# Patient Record
Sex: Female | Born: 1965 | Race: White | Hispanic: No | Smoking: Current every day smoker
Health system: Southern US, Community
[De-identification: ages and names within clinical notes are randomized; demographics above are authoritative.]

## PROBLEM LIST (undated history)

## (undated) DIAGNOSIS — F32A Depression, unspecified: Secondary | ICD-10-CM

## (undated) DIAGNOSIS — F329 Major depressive disorder, single episode, unspecified: Secondary | ICD-10-CM

## (undated) DIAGNOSIS — N939 Abnormal uterine and vaginal bleeding, unspecified: Secondary | ICD-10-CM

## (undated) DIAGNOSIS — K219 Gastro-esophageal reflux disease without esophagitis: Secondary | ICD-10-CM

## (undated) DIAGNOSIS — Z973 Presence of spectacles and contact lenses: Secondary | ICD-10-CM

## (undated) DIAGNOSIS — T4145XA Adverse effect of unspecified anesthetic, initial encounter: Secondary | ICD-10-CM

## (undated) DIAGNOSIS — F419 Anxiety disorder, unspecified: Secondary | ICD-10-CM

## (undated) DIAGNOSIS — I10 Essential (primary) hypertension: Secondary | ICD-10-CM

## (undated) DIAGNOSIS — T8859XA Other complications of anesthesia, initial encounter: Secondary | ICD-10-CM

## (undated) DIAGNOSIS — D649 Anemia, unspecified: Secondary | ICD-10-CM

## (undated) DIAGNOSIS — D259 Leiomyoma of uterus, unspecified: Secondary | ICD-10-CM

## (undated) HISTORY — DX: Major depressive disorder, single episode, unspecified: F32.9

## (undated) HISTORY — DX: Anxiety disorder, unspecified: F41.9

## (undated) HISTORY — DX: Essential (primary) hypertension: I10

## (undated) HISTORY — DX: Depression, unspecified: F32.A

## (undated) HISTORY — DX: Anemia, unspecified: D64.9

---
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
## 1983-04-25 HISTORY — PX: WISDOM TOOTH EXTRACTION: SHX21

## 1998-03-15 ENCOUNTER — Other Ambulatory Visit: Admission: RE | Admit: 1998-03-15 | Discharge: 1998-03-15 | Payer: Self-pay | Admitting: Obstetrics and Gynecology

## 1999-05-10 ENCOUNTER — Other Ambulatory Visit: Admission: RE | Admit: 1999-05-10 | Discharge: 1999-05-10 | Payer: Self-pay | Admitting: Obstetrics and Gynecology

## 2000-05-21 ENCOUNTER — Other Ambulatory Visit: Admission: RE | Admit: 2000-05-21 | Discharge: 2000-05-21 | Payer: Self-pay | Admitting: Obstetrics and Gynecology

## 2001-05-28 ENCOUNTER — Other Ambulatory Visit: Admission: RE | Admit: 2001-05-28 | Discharge: 2001-05-28 | Payer: Self-pay | Admitting: Obstetrics and Gynecology

## 2002-05-28 ENCOUNTER — Other Ambulatory Visit: Admission: RE | Admit: 2002-05-28 | Discharge: 2002-05-28 | Payer: Self-pay | Admitting: Obstetrics and Gynecology

## 2003-06-09 ENCOUNTER — Other Ambulatory Visit: Admission: RE | Admit: 2003-06-09 | Discharge: 2003-06-09 | Payer: Self-pay | Admitting: Obstetrics and Gynecology

## 2004-06-20 ENCOUNTER — Other Ambulatory Visit: Admission: RE | Admit: 2004-06-20 | Discharge: 2004-06-20 | Payer: Self-pay | Admitting: Obstetrics and Gynecology

## 2004-12-12 ENCOUNTER — Other Ambulatory Visit: Admission: RE | Admit: 2004-12-12 | Discharge: 2004-12-12 | Payer: Self-pay | Admitting: Obstetrics and Gynecology

## 2004-12-15 ENCOUNTER — Encounter: Admission: RE | Admit: 2004-12-15 | Discharge: 2004-12-15 | Payer: Self-pay | Admitting: Obstetrics and Gynecology

## 2006-08-01 ENCOUNTER — Ambulatory Visit: Payer: Self-pay | Admitting: Internal Medicine

## 2007-08-28 ENCOUNTER — Encounter: Payer: Self-pay | Admitting: Orthopedic Surgery

## 2008-07-30 ENCOUNTER — Other Ambulatory Visit (HOSPITAL_COMMUNITY): Admission: RE | Admit: 2008-07-30 | Discharge: 2008-08-07 | Payer: Self-pay | Admitting: Psychiatry

## 2008-08-03 ENCOUNTER — Ambulatory Visit: Payer: Self-pay | Admitting: Psychiatry

## 2010-09-09 NOTE — Assessment & Plan Note (Signed)
[name]                         GASTROENTEROLOGY OFFICE NOTE   NAME:VERNONSigourney, Kim                        MRN:          [id number]  DATE:08/01/2006                            DOB:          1965-12-01    [name] is a very nice 45 year old white female referred by Dr. Timothy Lasso  for discussion of colorectal screening.  We saw her about 20 years ago  in association with her father, who had colon cancer in his early 42s,  and died shortly after the diagnosis was made.  At that time, I told  patient she ought to have a colonoscopy around the age of 32.  She is  essentially asymptomatic.  Dr. Timothy Lasso has done stool Hemoccult's.  She  has good eating habits.  She denies any rectal bleeding.   MEDICATIONS:  1. Lamictal 100 mg 2 p.o. nightly.  2. Prozac 20 mg 2 nightly.  3. Clonazepam 0.25 mg b.i.d.   PAST MEDICAL HISTORY:  Significant for hypoglycemia.  She had a  dysplastic mole taken off her skin.  She has history of headaches.  History of tonsillectomy and wisdom teeth extraction.   FAMILY HISTORY:  Positive for colon cancer.   SOCIAL HISTORY:  Married.  No children.  She works as a  Energy manager.  Patient does not smoke.  Drinks alcohol only  socially.   REVIEW OF SYSTEMS:  Positive for contact lenses.  Slight anemia.  Hemoglobin 11.9.  Hematocrit 30.7 with MCV of 97.   PHYSICAL EXAMINATION:  Blood pressure 136/88.  Pulse 68.  Weight 109  Kim.  She was alert, oriented, and in no distress.  Sclerae were anicteric.  Oral cavity was normal.  NECK:  Supple.  No adenopathy.  LUNGS:  Clear to auscultation.  COR:  With normal S1 and normal S2.  ABDOMEN:  Soft.  She had a full bladder.  Somewhat tympanically  distended abdomen, but non-tender except for the area over the bladder.  No CVA tenderness.  RECTAL:  Exam not done.  EXTREMITIES:  No edema.   IMPRESSION:  A 45 year old white female with known family history of  colon cancer in her  father, who died in early 29s shortly after the  diagnosis was made.  Her sister already had 2 colonoscopies, and  has  had polyps, but we do not have that  documantation.  Patient, herself,  is interested in having colorectal screening. She remains asymptomatic.  She has a slight normocytic anemia.   PLAN:  Colonoscopy discussed with the patient.  She wants to go ahead  with the exam using standard  prep.  She is not on any medications that  would compromise the exam.  We discussed conscious sedation, prep, and  the procedure itself.     Hedwig Morton. Juanda Chance, MD  Electronically Signed    DMB/MedQ  DD: 08/01/2006  DT: 08/01/2006  Job #: [id number]   cc:   Katie Pounds, MD

## 2014-01-29 ENCOUNTER — Encounter: Payer: Self-pay | Admitting: Internal Medicine

## 2014-04-03 ENCOUNTER — Encounter: Payer: Self-pay | Admitting: Internal Medicine

## 2014-08-03 ENCOUNTER — Encounter: Payer: Self-pay | Admitting: Internal Medicine

## 2014-09-18 ENCOUNTER — Ambulatory Visit (AMBULATORY_SURGERY_CENTER): Payer: Self-pay | Admitting: *Deleted

## 2014-09-18 VITALS — Ht 59.5 in | Wt 129.5 lb

## 2014-09-18 DIAGNOSIS — Z1211 Encounter for screening for malignant neoplasm of colon: Secondary | ICD-10-CM

## 2014-09-18 NOTE — Progress Notes (Signed)
No allergies to eggs or soy. No problems with anesthesia.  Pt given Emmi instructions for colonoscopy  No oxygen use  No diet drug use  

## 2014-09-23 HISTORY — PX: COLONOSCOPY: SHX174

## 2014-10-02 ENCOUNTER — Ambulatory Visit (AMBULATORY_SURGERY_CENTER): Payer: PRIVATE HEALTH INSURANCE | Admitting: Internal Medicine

## 2014-10-02 ENCOUNTER — Encounter: Payer: Self-pay | Admitting: Internal Medicine

## 2014-10-02 VITALS — BP 122/78 | HR 66 | Temp 97.9°F | Resp 21 | Ht 59.0 in | Wt 129.0 lb

## 2014-10-02 DIAGNOSIS — Z800 Family history of malignant neoplasm of digestive organs: Secondary | ICD-10-CM | POA: Diagnosis not present

## 2014-10-02 DIAGNOSIS — Z1211 Encounter for screening for malignant neoplasm of colon: Secondary | ICD-10-CM

## 2014-10-02 DIAGNOSIS — K635 Polyp of colon: Secondary | ICD-10-CM

## 2014-10-02 DIAGNOSIS — D125 Benign neoplasm of sigmoid colon: Secondary | ICD-10-CM

## 2014-10-02 MED ORDER — SODIUM CHLORIDE 0.9 % IV SOLN: 500.0000 mL | INTRAVENOUS | Status: DC

## 2014-10-02 NOTE — Progress Notes (Signed)
Report to PACU, RN, vss, BBS= Clear.  

## 2014-10-02 NOTE — Patient Instructions (Signed)
YOU HAD AN ENDOSCOPIC PROCEDURE TODAY AT THE [facility]:   Refer to the procedure report that was given to you for any specific questions about what was found during the examination.  If the procedure report does not answer your questions, please call your gastroenterologist to clarify.  If you requested that your care partner not be given the details of your procedure findings, then the procedure report has been included in a sealed envelope for you to review at your convenience later.  YOU SHOULD EXPECT: Some feelings of bloating in the abdomen. Passage of more gas than usual.  Walking can help get rid of the air that was put into your GI tract during the procedure and reduce the bloating. If you had a lower endoscopy (such as a colonoscopy or flexible sigmoidoscopy) you may notice spotting of blood in your stool or on the toilet paper. If you underwent a bowel prep for your procedure, you may not have a normal bowel movement for a few days.  Please Note:  You might notice some irritation and congestion in your nose or some drainage.  This is from the oxygen used during your procedure.  There is no need for concern and it should clear up in a day or so.  SYMPTOMS TO REPORT IMMEDIATELY:   Following lower endoscopy (colonoscopy or flexible sigmoidoscopy):  Excessive amounts of blood in the stool  Significant tenderness or worsening of abdominal pains  Swelling of the abdomen that is new, acute  Fever of 100F or higher    For urgent or emergent issues, a gastroenterologist can be reached at any hour by calling [phone].   DIET: Your first meal following the procedure should be a small meal and then it is ok to progress to your normal diet. Heavy or fried foods are harder to digest and may make you feel nauseous or bloated.  Likewise, meals heavy in dairy and vegetables can increase bloating.  Drink plenty of fluids but you should avoid alcoholic beverages for 24  hours.  ACTIVITY:  You should plan to take it easy for the rest of today and you should NOT DRIVE or use heavy machinery until tomorrow (because of the sedation medicines used during the test).    FOLLOW UP: Our staff will call the number listed on your records the next business day following your procedure to check on you and address any questions or concerns that you may have regarding the information given to you following your procedure. If we do not reach you, we will leave a message.  However, if you are feeling well and you are not experiencing any problems, there is no need to return our call.  We will assume that you have returned to your regular daily activities without incident.  If any biopsies were taken you will be contacted by phone or by letter within the next 1-3 weeks.  Please call us at [phone] if you have not heard about the biopsies in 3 weeks.    SIGNATURES/CONFIDENTIALITY: You and/or your care partner have signed paperwork which will be entered into your electronic medical record.  These signatures attest to the fact that that the information above on your After Visit Summary has been reviewed and is understood.  Full responsibility of the confidentiality of this discharge information lies with you and/or your care-partner.   Information on polyps given to you today 

## 2014-10-02 NOTE — Progress Notes (Signed)
Called to room to assist during endoscopic procedure.  Patient ID and intended procedure confirmed with present staff. Received instructions for my participation in the procedure from the performing physician.  

## 2014-10-02 NOTE — Op Note (Addendum)
[facility] [place], [address]   COLONOSCOPY PROCEDURE REPORT  PATIENT: Katie, Kim  MR#: [id number] BIRTHDATE: May 28, 1965 , 48  yrs. old GENDER: female ENDOSCOPIST: Lafayette Dragon, MD REFERRED FK:CLEX Virgina Jock, M.D. PROCEDURE DATE:  10/02/2014 PROCEDURE:   Colonoscopy, screening First Screening Colonoscopy - high  risk and is 50 yrs.  old or older Yes.  Prior Negative Screening - Now for repeat screening. N/A  History of Adenoma - Now for follow-up colonoscopy & has been > or = to 3 yrs.  N/A  Polyps removed today? Yes ASA CLASS:   Class I INDICATIONS:Screening for colonic neoplasia and Colorectal Neoplasm Risk Assessment for this procedure is above average risk. ,Father with colon cancer at age 44 MEDICATIONS: Monitored anesthesia care and Propofol 300 mg IV  DESCRIPTION OF PROCEDURE:   After the risks benefits and alternatives of the procedure were thoroughly explained, informed consent was obtained.  The digital rectal exam revealed no abnormalities of the rectum.   The LB [id number]  endoscope was introduced through the anus and advanced to the cecum, which was identified by both the appendix and ileocecal valve. No adverse events experienced.   The quality of the prep was fair.  The instrument was then slowly withdrawn as the colon was fully examined. Estimated blood loss is zero unless otherwise noted in this procedure report.      COLON FINDINGS: A firm sessile polyp measuring 3 mm in size was found in the sigmoid colon.  A polypectomy was performed with cold forceps.  The resection was complete, the polyp tissue was completely retrieved and sent to histology.  Retroflexed views revealed no abnormalities. The time to cecum = 11.37 Withdrawal time = 9.31   The scope was withdrawn and the procedure completed. COMPLICATIONS: There were no immediate complications.  ENDOSCOPIC IMPRESSION: Sessile polyp was found in the sigmoid  colon; polypectomy was performed with cold for   RECOMMENDATIONS: Await pathology results High fiber diet recall colonoscopy in 5 years Consider 2 day prep next time  eSigned:  Lafayette Dragon, MD 10/02/2014 8:16 AM Revised: 10/02/2014 8:16 AM  cc:   PATIENT NAME:  Katie, Kim MR#: [id number]

## 2014-10-05 ENCOUNTER — Telehealth: Payer: Self-pay

## 2014-10-05 NOTE — Telephone Encounter (Signed)
  Follow up Call-  Call back number 10/02/2014  Post procedure Call Back phone  # [phone]  Permission to leave phone message Yes     Patient questions:  Do you have a fever, pain , or abdominal swelling? No. Pain Score  0 *  Have you tolerated food without any problems? Yes.    Have you been able to return to your normal activities? Yes.    Do you have any questions about your discharge instructions: Diet   No. Medications  No. Follow up visit  No.  Do you have questions or concerns about your Care? No.  Actions: * If pain score is 4 or above: No action needed, pain <4.

## 2014-10-07 ENCOUNTER — Encounter: Payer: Self-pay | Admitting: Internal Medicine

## 2014-10-23 ENCOUNTER — Telehealth: Payer: Self-pay | Admitting: Internal Medicine

## 2014-10-23 NOTE — Telephone Encounter (Signed)
Patient notified of results.

## 2014-10-29 NOTE — H&P (Signed)
[name]  DICTATION # [id number] CSN# [id number]   Margarette Asal, MD 10/29/2014 10:05 AM

## 2014-10-30 NOTE — H&P (Signed)
[name], [name]               ACCOUNT NO.:  [id number]  MEDICAL RECORD NO.:  [id number]  LOCATION:                                FACILITY:  [facility]  PHYSICIAN:  Ralene Bathe. Matthew Saras, M.D.DATE OF BIRTH:  [birth year]  DATE OF ADMISSION:  11/12/2014 DATE OF DISCHARGE:                             HISTORY & PHYSICAL   CHIEF COMPLAINT:  Perimenopausal bleeding, endometrial polyp.  The patient has been complaining of menorrhagia recently.  FSH was 3.7.  She had a saline ultrasound that showed a well-defined polyp.  Presents at this time for D and C, hysteroscopy, with MyoSure resection.  This procedure including specific risks related to bleeding, infection, and other complications such as perforation, may require additional surgery discussed with her which she understands and accepts.  PAST MEDICAL HISTORY:  Allergies:  None.  CURRENT MEDICATIONS:  Lisinopril, Flexeril, Inderal, Abilify, clonazepam, Deplin, iron supplements.  PAST SURGICAL HISTORY:  Prior tonsillectomy.  REVIEW OF SYSTEMS:  Significant for anemia, osteopenia, hypertension.  FAMILY HISTORY:  Significant for anemia, stroke, osteoporosis, arthritis, skin disease, hypertension.  Father with colon cancer.  SOCIAL HISTORY:  Denies drug use.  She does smoke 1/2 PPD.  Social alcohol drinker.  She is divorced.  Dr. Shon Baton is her medical doctor.  PHYSICAL EXAMINATION:  VITAL SIGNS:  Temp 98.2, blood pressure 132/90. HEENT:  Unremarkable. NECK:  Supple without masses. LUNGS:  Clear.  CARDIOVASCULAR:  Regular rate and rhythm without murmurs, rubs, or gallops noted. BREASTS:  Without masses. ABDOMEN:  Soft, flat, nontender.  GU:  Vulva, vagina, cervix normal. Uterus mid position, normal size.  Adnexa negative.  IMPRESSION:  Menorrhagia with endometrial polyp.  PLAN:  Recent ultrasound showed possible adenomyosis with 3.0 x 2.9 cm intramural fibroid with well-defined polyp noted on saline infusion.     Unnamed Hino M.  Matthew Saras, M.D.     [facility]/MEDQ  D:  10/29/2014  T:  10/30/2014  Job:  [id number]

## 2014-11-11 ENCOUNTER — Other Ambulatory Visit: Payer: Self-pay

## 2014-11-11 ENCOUNTER — Encounter (HOSPITAL_COMMUNITY): Payer: Self-pay

## 2014-11-11 ENCOUNTER — Encounter (HOSPITAL_COMMUNITY)
Admission: RE | Admit: 2014-11-11 | Discharge: 2014-11-11 | Disposition: A | Discharge status: Home / Self Care | Payer: PRIVATE HEALTH INSURANCE | Source: Ambulatory Visit | Attending: Obstetrics and Gynecology | Admitting: Obstetrics and Gynecology

## 2014-11-11 DIAGNOSIS — M8580 Other specified disorders of bone density and structure, unspecified site: Secondary | ICD-10-CM | POA: Diagnosis not present

## 2014-11-11 DIAGNOSIS — N920 Excessive and frequent menstruation with regular cycle: Secondary | ICD-10-CM | POA: Diagnosis present

## 2014-11-11 DIAGNOSIS — F17210 Nicotine dependence, cigarettes, uncomplicated: Secondary | ICD-10-CM | POA: Diagnosis not present

## 2014-11-11 DIAGNOSIS — N840 Polyp of corpus uteri: Secondary | ICD-10-CM | POA: Diagnosis not present

## 2014-11-11 DIAGNOSIS — I10 Essential (primary) hypertension: Secondary | ICD-10-CM | POA: Diagnosis not present

## 2014-11-11 DIAGNOSIS — D649 Anemia, unspecified: Secondary | ICD-10-CM | POA: Diagnosis not present

## 2014-11-11 DIAGNOSIS — Z79899 Other long term (current) drug therapy: Secondary | ICD-10-CM | POA: Diagnosis not present

## 2014-11-11 HISTORY — DX: Gastro-esophageal reflux disease without esophagitis: K21.9

## 2014-11-11 LAB — BASIC METABOLIC PANEL
ANION GAP: 3 — AB (ref 5–15)
BUN: 11 mg/dL (ref 6–20)
CHLORIDE: 102 mmol/L (ref 101–111)
CO2: 28 mmol/L (ref 22–32)
CREATININE: 0.7 mg/dL (ref 0.44–1.00)
Calcium: 8.5 mg/dL — ABNORMAL LOW (ref 8.9–10.3)
Glucose, Bld: 86 mg/dL (ref 65–99)
POTASSIUM: 4 mmol/L (ref 3.5–5.1)
Sodium: 133 mmol/L — ABNORMAL LOW (ref 135–145)

## 2014-11-11 LAB — CBC
HCT: 32.9 % — ABNORMAL LOW (ref 36.0–46.0)
Hemoglobin: 11.1 g/dL — ABNORMAL LOW (ref 12.0–15.0)
MCH: 33.3 pg (ref 26.0–34.0)
MCHC: 33.7 g/dL (ref 30.0–36.0)
MCV: 98.8 fL (ref 78.0–100.0)
PLATELETS: 281 10*3/uL (ref 150–400)
RBC: 3.33 MIL/uL — AB (ref 3.87–5.11)
RDW: 13.4 % (ref 11.5–15.5)
WBC: 6.2 10*3/uL (ref 4.0–10.5)

## 2014-11-11 MED ORDER — DEXTROSE 5 % IV SOLN
2.0000 g | INTRAVENOUS | Status: AC
  Administered 2014-11-12: 2 g via INTRAVENOUS
  Filled 2014-11-11: qty 2

## 2014-11-11 NOTE — Patient Instructions (Signed)
Your procedure is scheduled on:  November 12, 2014  Enter through the Main Entrance of [facility] at:  11:00 am    Pick up the phone at the desk and dial [phone].  Call this number if you have problems the morning of surgery: [phone].  Remember: Do NOT eat food: after midnight tonight   Do NOT drink clear liquids after:   After 8:30 day of surgery  Take these medicines the morning of surgery with a SIP OF WATER:   Lisinopril, Inderal and Clonazepam if needed   Do NOT wear jewelry (body piercing), metal hair clips/bobby pins, make-up, or nail polish. Do NOT wear lotions, powders, or perfumes.  You may wear deoderant. Do NOT shave for 48 hours prior to surgery. Do NOT bring valuables to the hospital. Contacts, dentures, or bridgework may not be worn into surgery.  Have a responsible adult drive you home and stay with you for 24 hours after your procedure

## 2014-11-12 ENCOUNTER — Ambulatory Visit (HOSPITAL_COMMUNITY): Payer: PRIVATE HEALTH INSURANCE | Admitting: Anesthesiology

## 2014-11-12 ENCOUNTER — Ambulatory Visit (HOSPITAL_COMMUNITY)
Admission: RE | Admit: 2014-11-12 | Discharge: 2014-11-12 | Disposition: A | Discharge status: Home / Self Care | Payer: PRIVATE HEALTH INSURANCE | Source: Ambulatory Visit | Attending: Obstetrics and Gynecology | Admitting: Obstetrics and Gynecology

## 2014-11-12 ENCOUNTER — Encounter (HOSPITAL_COMMUNITY): Payer: Self-pay

## 2014-11-12 ENCOUNTER — Encounter (HOSPITAL_COMMUNITY)
Admission: RE | Disposition: A | Discharge status: Home / Self Care | Payer: Self-pay | Source: Ambulatory Visit | Attending: Obstetrics and Gynecology

## 2014-11-12 DIAGNOSIS — F17210 Nicotine dependence, cigarettes, uncomplicated: Secondary | ICD-10-CM | POA: Insufficient documentation

## 2014-11-12 DIAGNOSIS — N840 Polyp of corpus uteri: Secondary | ICD-10-CM | POA: Insufficient documentation

## 2014-11-12 DIAGNOSIS — D649 Anemia, unspecified: Secondary | ICD-10-CM | POA: Insufficient documentation

## 2014-11-12 DIAGNOSIS — M8580 Other specified disorders of bone density and structure, unspecified site: Secondary | ICD-10-CM | POA: Insufficient documentation

## 2014-11-12 DIAGNOSIS — I10 Essential (primary) hypertension: Secondary | ICD-10-CM | POA: Insufficient documentation

## 2014-11-12 DIAGNOSIS — Z79899 Other long term (current) drug therapy: Secondary | ICD-10-CM | POA: Insufficient documentation

## 2014-11-12 HISTORY — PX: DILATATION & CURETTAGE/HYSTEROSCOPY WITH MYOSURE: SHX6511

## 2014-11-12 LAB — PREGNANCY, URINE: PREG TEST UR: NEGATIVE

## 2014-11-12 LAB — TYPE AND SCREEN
ABO/RH(D): O POS
ANTIBODY SCREEN: NEGATIVE

## 2014-11-12 LAB — ABO/RH: ABO/RH(D): O POS

## 2014-11-12 SURGERY — DILATATION & CURETTAGE/HYSTEROSCOPY WITH MYOSURE
Anesthesia: Choice

## 2014-11-12 MED ORDER — CEFAZOLIN SODIUM-DEXTROSE 2-3 GM-% IV SOLR
INTRAVENOUS | Status: AC
  Filled 2014-11-12: qty 50

## 2014-11-12 MED ORDER — PROPOFOL 10 MG/ML IV BOLUS
INTRAVENOUS | Status: AC
  Filled 2014-11-12: qty 20

## 2014-11-12 MED ORDER — ONDANSETRON HCL 4 MG/2ML IJ SOLN
INTRAMUSCULAR | Status: DC | PRN
  Administered 2014-11-12: 4 mg via INTRAVENOUS

## 2014-11-12 MED ORDER — DEXAMETHASONE SODIUM PHOSPHATE 4 MG/ML IJ SOLN
INTRAMUSCULAR | Status: AC
  Filled 2014-11-12: qty 1

## 2014-11-12 MED ORDER — ACETAMINOPHEN 160 MG/5ML PO SOLN
975.0000 mg | Freq: Once | ORAL | Status: AC
  Administered 2014-11-12: 975 mg via ORAL

## 2014-11-12 MED ORDER — FENTANYL CITRATE (PF) 100 MCG/2ML IJ SOLN: 25.0000 ug | INTRAMUSCULAR | Status: DC | PRN

## 2014-11-12 MED ORDER — FENTANYL CITRATE (PF) 100 MCG/2ML IJ SOLN
INTRAMUSCULAR | Status: DC | PRN
  Administered 2014-11-12: 50 ug via INTRAVENOUS

## 2014-11-12 MED ORDER — SCOPOLAMINE 1 MG/3DAYS TD PT72
1.0000 | MEDICATED_PATCH | Freq: Once | TRANSDERMAL | Status: DC
  Administered 2014-11-12: 1.5 mg via TRANSDERMAL

## 2014-11-12 MED ORDER — KETOROLAC TROMETHAMINE 30 MG/ML IJ SOLN
INTRAMUSCULAR | Status: DC | PRN
  Administered 2014-11-12: 30 mg via INTRAVENOUS

## 2014-11-12 MED ORDER — SCOPOLAMINE 1 MG/3DAYS TD PT72
MEDICATED_PATCH | TRANSDERMAL | Status: AC
  Administered 2014-11-12: 1.5 mg via TRANSDERMAL
  Filled 2014-11-12: qty 1

## 2014-11-12 MED ORDER — LACTATED RINGERS IV SOLN
INTRAVENOUS | Status: DC
  Administered 2014-11-12 (×2): via INTRAVENOUS

## 2014-11-12 MED ORDER — FENTANYL CITRATE (PF) 100 MCG/2ML IJ SOLN
INTRAMUSCULAR | Status: AC
  Filled 2014-11-12: qty 2

## 2014-11-12 MED ORDER — ONDANSETRON HCL 4 MG/2ML IJ SOLN
INTRAMUSCULAR | Status: AC
  Filled 2014-11-12: qty 2

## 2014-11-12 MED ORDER — LIDOCAINE HCL (CARDIAC) 20 MG/ML IV SOLN
INTRAVENOUS | Status: DC | PRN
  Administered 2014-11-12: 50 mg via INTRAVENOUS

## 2014-11-12 MED ORDER — DEXAMETHASONE SODIUM PHOSPHATE 10 MG/ML IJ SOLN
INTRAMUSCULAR | Status: DC | PRN
  Administered 2014-11-12: 4 mg via INTRAVENOUS

## 2014-11-12 MED ORDER — ACETAMINOPHEN 160 MG/5ML PO SOLN
ORAL | Status: AC
  Filled 2014-11-12: qty 20.3

## 2014-11-12 MED ORDER — LIDOCAINE HCL 1 % IJ SOLN
INTRAMUSCULAR | Status: AC
  Filled 2014-11-12: qty 20

## 2014-11-12 MED ORDER — MIDAZOLAM HCL 2 MG/2ML IJ SOLN
INTRAMUSCULAR | Status: AC
  Filled 2014-11-12: qty 2

## 2014-11-12 MED ORDER — ACETAMINOPHEN 160 MG/5ML PO SOLN
ORAL | Status: AC
  Administered 2014-11-12: 975 mg via ORAL
  Filled 2014-11-12: qty 20.3

## 2014-11-12 MED ORDER — KETOROLAC TROMETHAMINE 30 MG/ML IJ SOLN
INTRAMUSCULAR | Status: AC
Start: 2014-11-12 — End: 2014-11-12
  Filled 2014-11-12: qty 1

## 2014-11-12 MED ORDER — PROPOFOL 10 MG/ML IV BOLUS
INTRAVENOUS | Status: DC | PRN
  Administered 2014-11-12: 200 mg via INTRAVENOUS

## 2014-11-12 MED ORDER — LIDOCAINE HCL 1 % IJ SOLN
INTRAMUSCULAR | Status: DC | PRN
  Administered 2014-11-12: 9 mL

## 2014-11-12 MED ORDER — GLYCOPYRROLATE 0.2 MG/ML IJ SOLN
INTRAMUSCULAR | Status: AC
  Filled 2014-11-12: qty 1

## 2014-11-12 MED ORDER — DEXAMETHASONE SODIUM PHOSPHATE 10 MG/ML IJ SOLN
INTRAMUSCULAR | Status: AC
  Filled 2014-11-12: qty 1

## 2014-11-12 MED ORDER — GLYCOPYRROLATE 0.2 MG/ML IJ SOLN
INTRAMUSCULAR | Status: DC | PRN
  Administered 2014-11-12: 0.1 mg via INTRAVENOUS

## 2014-11-12 SURGICAL SUPPLY — 15 items
CATH ROBINSON RED A/P 16FR (CATHETERS) ×3 IMPLANT
CLOTH BEACON ORANGE TIMEOUT ST (SAFETY) ×3 IMPLANT
CONTAINER PREFILL 10% NBF 60ML (FORM) ×6 IMPLANT
DEVICE MYOSURE CLASSIC (MISCELLANEOUS) IMPLANT
DEVICE MYOSURE LITE (MISCELLANEOUS) IMPLANT
FILTER ARTHROSCOPY CONVERTOR (FILTER) ×3 IMPLANT
GLOVE BIO SURGEON STRL SZ7 (GLOVE) ×6 IMPLANT
GOWN STRL REUS W/TWL LRG LVL3 (GOWN DISPOSABLE) ×6 IMPLANT
PACK VAGINAL MINOR WOMEN LF (CUSTOM PROCEDURE TRAY) ×3 IMPLANT
PAD OB MATERNITY 4.3X12.25 (PERSONAL CARE ITEMS) ×3 IMPLANT
SEAL ROD LENS SCOPE MYOSURE (ABLATOR) ×3 IMPLANT
TOWEL OR 17X24 6PK STRL BLUE (TOWEL DISPOSABLE) ×6 IMPLANT
TUBING AQUILEX INFLOW (TUBING) ×3 IMPLANT
TUBING AQUILEX OUTFLOW (TUBING) ×3 IMPLANT
WATER STERILE IRR 1000ML POUR (IV SOLUTION) ×3 IMPLANT

## 2014-11-12 NOTE — Progress Notes (Signed)
The patient was re-examined with no change in status 

## 2014-11-12 NOTE — Op Note (Signed)
Preoperative diagnosis: Menorrhagia, endometrial polyp  Postoperative diagnosis: Same  Procedure: D&C hysteroscopy  Surgeon: Matthew Saras  Anesthesia: Gen.  Procedure and findings:  The patient taken the operating room after an adequate level of general anesthesia was obtained the patient's legs in stirrups the perineum and vagina were prepped and draped in the usual fashion, the bladder was drained. Appropriate timeouts taken at that point. EUA carried out uterus was small midposition, mobile adnexa negative. Speculum was positioned cervix grasped with a tenaculum, paracervical block created by infiltrating at 3 and 9:00 submucosally, 5-7 cc 1% plain Xylocaine at each site after negative aspiration. Uterus was sounded to 8-9 cm progressively dilated to 26-27 Pratt dilator, the continuous flow hysteroscope was inserted cavity was well visualized there was a moderate buildup of tissue no discrete polypoid areas. Scope was removed sharp curettage was carried out moderate tissue was removed this was sent to pathology scope was reinserted the cavity was irrigated both tubal ostia could be visualized there was no residual polypoid or other endometrial buildup, the cavity was fairly clean at that point. She tolerated this well went to recovery room in good condition.  Dictated with [place].

## 2014-11-12 NOTE — Discharge Instructions (Signed)

## 2014-11-12 NOTE — Anesthesia Preprocedure Evaluation (Signed)
Anesthesia Evaluation  Patient identified by MRN, date of birth, ID band Patient awake    Reviewed: Allergy & Precautions, H&P , Patient's Chart, lab work & pertinent test results, reviewed documented beta blocker date and time   Airway Mallampati: II  TM Distance: >3 FB Neck ROM: full    Dental no notable dental hx.    Pulmonary Current Smoker,  breath sounds clear to auscultation  Pulmonary exam normal       Cardiovascular hypertension, On Medications and On Home Beta Blockers Rhythm:regular Rate:Normal     Neuro/Psych    GI/Hepatic   Endo/Other    Renal/GU      Musculoskeletal   Abdominal   Peds  Hematology   Anesthesia Other Findings One med for HTN; BBlocker for Sx of SSRI's  Reproductive/Obstetrics                             Anesthesia Physical Anesthesia Plan  ASA: II  Anesthesia Plan:    Post-op Pain Management:    Induction: Intravenous  Airway Management Planned: LMA  Additional Equipment:   Intra-op Plan:   Post-operative Plan:   Informed Consent: I have reviewed the patients History and Physical, chart, labs and discussed the procedure including the risks, benefits and alternatives for the proposed anesthesia with the patient or authorized representative who has indicated his/her understanding and acceptance.   Dental Advisory Given and Dental advisory given  Plan Discussed with: CRNA and Surgeon  Anesthesia Plan Comments: (Discussed GA with LMA, possible sore throat, potential need to switch to ETT, N/V, pulmonary aspiration. Questions answered. )        Anesthesia Quick Evaluation

## 2014-11-12 NOTE — Anesthesia Procedure Notes (Signed)
Procedure Name: LMA Insertion Date/Time: 11/12/2014 12:37 PM Performed by: Adalberto Ill Pre-anesthesia Checklist: Emergency Drugs available, Suction available, Timeout performed, Patient being monitored and Patient identified Patient Re-evaluated:Patient Re-evaluated prior to inductionOxygen Delivery Method: Circle system utilized Preoxygenation: Pre-oxygenation with 100% oxygen Intubation Type: IV induction Ventilation: Mask ventilation without difficulty LMA: LMA inserted LMA Size: 4.0 Number of attempts: 1 Placement Confirmation: positive ETCO2 and breath sounds checked- equal and bilateral ETT to lip (cm): yes. Tube secured with: Tape Dental Injury: Teeth and Oropharynx as per pre-operative assessment

## 2014-11-12 NOTE — Transfer of Care (Signed)
Immediate Anesthesia Transfer of Care Note  Patient: [name]  Procedure(s) Performed: Procedure(s): DILATATION & CURETTAGE/HYSTEROSCOPY (N/A)  Patient Location: PACU  Anesthesia Type:General  Level of Consciousness: awake, alert , oriented and patient cooperative  Airway & Oxygen Therapy: Patient Spontanous Breathing and Patient connected to nasal cannula oxygen  Post-op Assessment: Report given to RN, Post -op Vital signs reviewed and stable and Patient moving all extremities X 4  Post vital signs: Reviewed and stable  Last Vitals:  Filed Vitals:   11/12/14 1104  BP: 123/75  Pulse: 79  Temp: 36.7 C  Resp: 18    Complications: No apparent anesthesia complications

## 2014-11-12 NOTE — Anesthesia Postprocedure Evaluation (Signed)
  Anesthesia Post-op Note  Patient: [name]  Procedure(s) Performed: Procedure(s) (LRB): DILATATION & CURETTAGE/HYSTEROSCOPY (N/A)  Patient Location: PACU  Anesthesia Type: General  Level of Consciousness: awake and alert   Airway and Oxygen Therapy: Patient Spontanous Breathing  Post-op Pain: mild  Post-op Assessment: Post-op Vital signs reviewed, Patient's Cardiovascular Status Stable, Respiratory Function Stable, Patent Airway and No signs of Nausea or vomiting  Last Vitals:  Filed Vitals:   11/12/14 1510  BP: 137/89  Pulse: 72  Temp: 36.9 C  Resp: 20    Post-op Vital Signs: stable   Complications: No apparent anesthesia complications

## 2014-11-14 ENCOUNTER — Encounter (HOSPITAL_COMMUNITY): Payer: Self-pay | Admitting: Obstetrics and Gynecology

## 2015-04-09 NOTE — H&P (Cosign Needed)
[name]  DICTATION # [id number] CSN# [id number]   Margarette Asal, MD 04/09/2015 9:43 AM

## 2015-04-12 NOTE — H&P (Signed)
[name], [name]               ACCOUNT NO.:  [id number]  MEDICAL RECORD NO.:  [id number]  LOCATION:                               FACILITY:  [facility]  PHYSICIAN:  [name]. Matthew Kim, M.D.DATE OF BIRTH:  [birth year]  DATE OF ADMISSION:  04/27/2015 DATE OF DISCHARGE:                             HISTORY & PHYSICAL   CHIEF COMPLAINT:  Menorrhagia.  HPI:  A 49 year old, G35, P0 who is not currently sexually active.  About a year ago, she saw her PCP with complaints of fatigue and heavy.  She was told she had anemia and was started on iron at that point.  She initially was evaluated here with sonohysterogram and blood work showing her [facility] was 3.7, that was in May 2016.  She also had demonstrated a polyp and underwent in July D and C, hysteroscopy, with removal of polyp, all the tissue was benign.  She has continued to have bleeding problems, does not want to pursue more conservative treatment and presents at this time for definitive LAVH with bilateral salpingectomy. The rationale for salpingectomy to reduce later life risk of ovarian cancer, discussed with her.  Other risks related to bleeding, infection, transfusion, adjacent organ injury, wound infection, phlebitis, the possible need to complete the surgery by open technique, all discussed with her which she understands and accepts.  PAST MEDICAL HISTORY:  Allergies:  None.  CURRENT MEDICATIONS:  Lisinopril, Effexor, Inderal, Abilify, clonazepam, Deplin, and iron.  PRIOR SURGERY:  D and C, hysteroscopy, tonsillectomy as a child.  FAMILY HISTORY:  Significant for anemia, stroke, osteopenia, arthritis, hypertension in father who had colon cancer.  SOCIAL HISTORY:  Denies drug use.  Smokes 1/2 PPD.  Drinks alcohol socially.  Dr. Shon Baton is her PCP.  Her last Pap April 2016, was normal.  PHYSICAL EXAM:  VITAL SIGNS:  Temp 98.2, blood pressure 130/78. HEENT:  Unremarkable. NECK:  Supple without masses. LUNGS:   Clear. CARDIOVASCULAR:  Regular rate and rhythm without murmurs, rubs, gallops noted. BREASTS:  Negative. ABDOMEN:  Soft, flat, nontender.  Vulva, vagina, cervix normal.  Uterus was normal size, mid position.  Adnexa negative.  EXTREMITIES: Unremarkable. NEUROLOGIC:  Unremarkable.  IMPRESSION:  Continued menorrhagia status post D and C, hysteroscopy, with removal of polyp, presents now for definitive LAVH.  Procedure and risks discussed as above.     [name], M.D.     [facility]/MEDQ  D:  04/09/2015  T:  04/09/2015  Job:  [id number]

## 2015-04-20 ENCOUNTER — Encounter (HOSPITAL_BASED_OUTPATIENT_CLINIC_OR_DEPARTMENT_OTHER): Payer: Self-pay | Admitting: *Deleted

## 2015-04-20 NOTE — Progress Notes (Signed)
NPO AFTER MN.   ARRIVE AT 0600.  NEEDS CBC, BMET, T & S, AND URINE PREG.  WILL TAKE KLONOPIN AND INDERAL AM DOS W/ SIPS OF WATER.

## 2015-04-27 ENCOUNTER — Ambulatory Visit (HOSPITAL_BASED_OUTPATIENT_CLINIC_OR_DEPARTMENT_OTHER): Payer: PRIVATE HEALTH INSURANCE | Admitting: Anesthesiology

## 2015-04-27 ENCOUNTER — Encounter (HOSPITAL_BASED_OUTPATIENT_CLINIC_OR_DEPARTMENT_OTHER): Payer: Self-pay

## 2015-04-27 ENCOUNTER — Observation Stay (HOSPITAL_BASED_OUTPATIENT_CLINIC_OR_DEPARTMENT_OTHER)
Admission: RE | Admit: 2015-04-27 | Discharge: 2015-04-28 | Disposition: A | Discharge status: Home / Self Care | Payer: PRIVATE HEALTH INSURANCE | Source: Ambulatory Visit | Attending: Obstetrics and Gynecology | Admitting: Obstetrics and Gynecology

## 2015-04-27 ENCOUNTER — Encounter (HOSPITAL_COMMUNITY)
Admission: RE | Disposition: A | Discharge status: Home / Self Care | Payer: Self-pay | Source: Ambulatory Visit | Attending: Obstetrics and Gynecology

## 2015-04-27 DIAGNOSIS — N838 Other noninflammatory disorders of ovary, fallopian tube and broad ligament: Secondary | ICD-10-CM | POA: Insufficient documentation

## 2015-04-27 DIAGNOSIS — N800 Endometriosis of uterus: Secondary | ICD-10-CM | POA: Insufficient documentation

## 2015-04-27 DIAGNOSIS — D219 Benign neoplasm of connective and other soft tissue, unspecified: Secondary | ICD-10-CM | POA: Diagnosis present

## 2015-04-27 DIAGNOSIS — K219 Gastro-esophageal reflux disease without esophagitis: Secondary | ICD-10-CM | POA: Diagnosis not present

## 2015-04-27 DIAGNOSIS — N920 Excessive and frequent menstruation with regular cycle: Principal | ICD-10-CM | POA: Insufficient documentation

## 2015-04-27 DIAGNOSIS — D649 Anemia, unspecified: Secondary | ICD-10-CM | POA: Insufficient documentation

## 2015-04-27 DIAGNOSIS — F17200 Nicotine dependence, unspecified, uncomplicated: Secondary | ICD-10-CM | POA: Diagnosis not present

## 2015-04-27 DIAGNOSIS — D259 Leiomyoma of uterus, unspecified: Secondary | ICD-10-CM | POA: Insufficient documentation

## 2015-04-27 DIAGNOSIS — I10 Essential (primary) hypertension: Secondary | ICD-10-CM | POA: Insufficient documentation

## 2015-04-27 HISTORY — PX: LAPAROSCOPIC ASSISTED VAGINAL HYSTERECTOMY: SHX5398

## 2015-04-27 HISTORY — PX: BILATERAL SALPINGECTOMY: SHX5743

## 2015-04-27 HISTORY — DX: Leiomyoma of uterus, unspecified: D25.9

## 2015-04-27 HISTORY — DX: Other complications of anesthesia, initial encounter: T88.59XA

## 2015-04-27 HISTORY — DX: Adverse effect of unspecified anesthetic, initial encounter: T41.45XA

## 2015-04-27 HISTORY — DX: Abnormal uterine and vaginal bleeding, unspecified: N93.9

## 2015-04-27 HISTORY — DX: Presence of spectacles and contact lenses: Z97.3

## 2015-04-27 LAB — CBC
HEMATOCRIT: 34.5 % — AB (ref 36.0–46.0)
Hemoglobin: 11.8 g/dL — ABNORMAL LOW (ref 12.0–15.0)
MCH: 34.2 pg — ABNORMAL HIGH (ref 26.0–34.0)
MCHC: 34.2 g/dL (ref 30.0–36.0)
MCV: 100 fL (ref 78.0–100.0)
Platelets: 310 10*3/uL (ref 150–400)
RBC: 3.45 MIL/uL — ABNORMAL LOW (ref 3.87–5.11)
RDW: 12.8 % (ref 11.5–15.5)
WBC: 5.2 10*3/uL (ref 4.0–10.5)

## 2015-04-27 LAB — TYPE AND SCREEN
ABO/RH(D): O POS
ANTIBODY SCREEN: NEGATIVE

## 2015-04-27 LAB — BASIC METABOLIC PANEL
ANION GAP: 9 (ref 5–15)
BUN: 9 mg/dL (ref 6–20)
CALCIUM: 8.6 mg/dL — AB (ref 8.9–10.3)
CO2: 27 mmol/L (ref 22–32)
Chloride: 102 mmol/L (ref 101–111)
Creatinine, Ser: 0.68 mg/dL (ref 0.44–1.00)
Glucose, Bld: 89 mg/dL (ref 65–99)
Potassium: 3.7 mmol/L (ref 3.5–5.1)
Sodium: 138 mmol/L (ref 135–145)

## 2015-04-27 LAB — POCT PREGNANCY, URINE: Preg Test, Ur: NEGATIVE

## 2015-04-27 LAB — ABO/RH: ABO/RH(D): O POS

## 2015-04-27 SURGERY — HYSTERECTOMY, VAGINAL, LAPAROSCOPY-ASSISTED
Anesthesia: General | Site: Vagina

## 2015-04-27 MED ORDER — IBUPROFEN 200 MG PO TABS: 800.0000 mg | ORAL_TABLET | Freq: Three times a day (TID) | ORAL | Status: DC | PRN

## 2015-04-27 MED ORDER — MIDAZOLAM HCL 2 MG/2ML IJ SOLN
INTRAMUSCULAR | Status: AC
  Filled 2015-04-27: qty 2

## 2015-04-27 MED ORDER — KETOROLAC TROMETHAMINE 30 MG/ML IJ SOLN
30.0000 mg | Freq: Four times a day (QID) | INTRAMUSCULAR | Status: DC
  Administered 2015-04-27 – 2015-04-28 (×5): 30 mg via INTRAVENOUS
  Filled 2015-04-27 (×8): qty 1

## 2015-04-27 MED ORDER — OXYCODONE HCL 5 MG PO TABS
5.0000 mg | ORAL_TABLET | Freq: Once | ORAL | Status: DC | PRN
  Filled 2015-04-27: qty 1

## 2015-04-27 MED ORDER — FENTANYL CITRATE (PF) 100 MCG/2ML IJ SOLN
25.0000 ug | INTRAMUSCULAR | Status: DC | PRN
  Administered 2015-04-27: 25 ug via INTRAVENOUS
  Filled 2015-04-27: qty 1

## 2015-04-27 MED ORDER — DIPHENHYDRAMINE HCL 50 MG/ML IJ SOLN: 12.5000 mg | Freq: Four times a day (QID) | INTRAMUSCULAR | Status: DC | PRN

## 2015-04-27 MED ORDER — ROCURONIUM BROMIDE 100 MG/10ML IV SOLN
INTRAVENOUS | Status: AC
  Filled 2015-04-27: qty 1

## 2015-04-27 MED ORDER — DEXAMETHASONE SODIUM PHOSPHATE 10 MG/ML IJ SOLN
INTRAMUSCULAR | Status: AC
  Filled 2015-04-27: qty 1

## 2015-04-27 MED ORDER — VENLAFAXINE HCL ER 150 MG PO CP24
300.0000 mg | ORAL_CAPSULE | Freq: Every evening | ORAL | Status: DC
  Administered 2015-04-27: 300 mg via ORAL
  Filled 2015-04-27 (×2): qty 2

## 2015-04-27 MED ORDER — DEXTROSE 5 % IV SOLN
2.0000 g | INTRAVENOUS | Status: AC
  Administered 2015-04-27: 2 g via INTRAVENOUS
  Filled 2015-04-27: qty 2

## 2015-04-27 MED ORDER — ONDANSETRON HCL 4 MG/2ML IJ SOLN: 4.0000 mg | Freq: Four times a day (QID) | INTRAMUSCULAR | Status: DC | PRN

## 2015-04-27 MED ORDER — FENTANYL CITRATE (PF) 100 MCG/2ML IJ SOLN
INTRAMUSCULAR | Status: AC
  Filled 2015-04-27: qty 2

## 2015-04-27 MED ORDER — SODIUM CHLORIDE 0.9 % IJ SOLN: 9.0000 mL | INTRAMUSCULAR | Status: DC | PRN

## 2015-04-27 MED ORDER — ONDANSETRON HCL 4 MG/2ML IJ SOLN
INTRAMUSCULAR | Status: DC | PRN
  Administered 2015-04-27: 4 mg via INTRAVENOUS

## 2015-04-27 MED ORDER — ROCURONIUM BROMIDE 100 MG/10ML IV SOLN
INTRAVENOUS | Status: DC | PRN
  Administered 2015-04-27: 1 mg via INTRAVENOUS
  Administered 2015-04-27: 3 mg via INTRAVENOUS

## 2015-04-27 MED ORDER — DEXTROSE IN LACTATED RINGERS 5 % IV SOLN
INTRAVENOUS | Status: DC
  Administered 2015-04-27 – 2015-04-28 (×3): via INTRAVENOUS

## 2015-04-27 MED ORDER — ONDANSETRON HCL 4 MG PO TABS: 4.0000 mg | ORAL_TABLET | Freq: Four times a day (QID) | ORAL | Status: DC | PRN

## 2015-04-27 MED ORDER — PROPRANOLOL HCL 10 MG PO TABS
10.0000 mg | ORAL_TABLET | Freq: Two times a day (BID) | ORAL | Status: DC
  Filled 2015-04-27 (×3): qty 1

## 2015-04-27 MED ORDER — BUPIVACAINE HCL 0.25 % IJ SOLN
INTRAMUSCULAR | Status: DC | PRN
  Administered 2015-04-27: 10 mL

## 2015-04-27 MED ORDER — DIPHENHYDRAMINE HCL 12.5 MG/5ML PO ELIX: 12.5000 mg | ORAL_SOLUTION | Freq: Four times a day (QID) | ORAL | Status: DC | PRN

## 2015-04-27 MED ORDER — OXYCODONE HCL 5 MG/5ML PO SOLN
5.0000 mg | Freq: Once | ORAL | Status: DC | PRN
  Filled 2015-04-27: qty 5

## 2015-04-27 MED ORDER — KETOROLAC TROMETHAMINE 30 MG/ML IJ SOLN
INTRAMUSCULAR | Status: AC
  Filled 2015-04-27: qty 1

## 2015-04-27 MED ORDER — NEOSTIGMINE METHYLSULFATE 10 MG/10ML IV SOLN
INTRAVENOUS | Status: AC
  Filled 2015-04-27: qty 1

## 2015-04-27 MED ORDER — KETOROLAC TROMETHAMINE 30 MG/ML IJ SOLN: 30.0000 mg | Freq: Once | INTRAMUSCULAR | Status: DC

## 2015-04-27 MED ORDER — MENTHOL 3 MG MT LOZG: 1.0000 | LOZENGE | OROMUCOSAL | Status: DC | PRN

## 2015-04-27 MED ORDER — ACETAMINOPHEN 325 MG PO TABS
325.0000 mg | ORAL_TABLET | ORAL | Status: DC | PRN
  Filled 2015-04-27: qty 2

## 2015-04-27 MED ORDER — GLYCOPYRROLATE 0.2 MG/ML IJ SOLN
INTRAMUSCULAR | Status: AC
  Filled 2015-04-27: qty 3

## 2015-04-27 MED ORDER — NALOXONE HCL 0.4 MG/ML IJ SOLN: 0.4000 mg | INTRAMUSCULAR | Status: DC | PRN

## 2015-04-27 MED ORDER — LISINOPRIL 20 MG PO TABS
20.0000 mg | ORAL_TABLET | Freq: Every morning | ORAL | Status: DC
  Administered 2015-04-27: 20 mg via ORAL
  Filled 2015-04-27 (×2): qty 1

## 2015-04-27 MED ORDER — ONDANSETRON HCL 4 MG/2ML IJ SOLN
INTRAMUSCULAR | Status: AC
  Filled 2015-04-27: qty 2

## 2015-04-27 MED ORDER — PROPOFOL 10 MG/ML IV BOLUS
INTRAVENOUS | Status: AC
  Filled 2015-04-27: qty 20

## 2015-04-27 MED ORDER — GLYCOPYRROLATE 0.2 MG/ML IJ SOLN
INTRAMUSCULAR | Status: DC | PRN
  Administered 2015-04-27: 0.4 mg via INTRAVENOUS
  Administered 2015-04-27: 0.2 mg via INTRAVENOUS

## 2015-04-27 MED ORDER — LACTATED RINGERS IV SOLN
INTRAVENOUS | Status: DC
  Administered 2015-04-27 (×2): via INTRAVENOUS
  Filled 2015-04-27: qty 1000

## 2015-04-27 MED ORDER — ACETAMINOPHEN 160 MG/5ML PO SOLN
325.0000 mg | ORAL | Status: DC | PRN
  Filled 2015-04-27: qty 20.3

## 2015-04-27 MED ORDER — MORPHINE SULFATE 2 MG/ML IV SOLN
INTRAVENOUS | Status: DC
  Administered 2015-04-27: 2 mg via INTRAVENOUS
  Administered 2015-04-27: 3 mg via INTRAVENOUS
  Administered 2015-04-27: 11:00:00 via INTRAVENOUS
  Filled 2015-04-27: qty 25

## 2015-04-27 MED ORDER — ARIPIPRAZOLE 5 MG PO TABS
5.0000 mg | ORAL_TABLET | Freq: Every evening | ORAL | Status: DC
  Administered 2015-04-27: 5 mg via ORAL
  Filled 2015-04-27 (×2): qty 1

## 2015-04-27 MED ORDER — LIDOCAINE HCL (CARDIAC) 20 MG/ML IV SOLN
INTRAVENOUS | Status: AC
Start: 2015-04-27 — End: 2015-04-27
  Filled 2015-04-27: qty 5

## 2015-04-27 MED ORDER — PROPOFOL 10 MG/ML IV BOLUS
INTRAVENOUS | Status: DC | PRN
  Administered 2015-04-27: 160 mg via INTRAVENOUS

## 2015-04-27 MED ORDER — FAMOTIDINE IN NACL 20-0.9 MG/50ML-% IV SOLN
20.0000 mg | Freq: Once | INTRAVENOUS | Status: AC
  Administered 2015-04-28: 20 mg via INTRAVENOUS
  Filled 2015-04-27 (×2): qty 50

## 2015-04-27 MED ORDER — EPHEDRINE SULFATE 50 MG/ML IJ SOLN
INTRAMUSCULAR | Status: AC
  Filled 2015-04-27: qty 1

## 2015-04-27 MED ORDER — BUTORPHANOL TARTRATE 1 MG/ML IJ SOLN: 1.0000 mg | INTRAMUSCULAR | Status: DC | PRN

## 2015-04-27 MED ORDER — KETOROLAC TROMETHAMINE 30 MG/ML IJ SOLN
30.0000 mg | Freq: Four times a day (QID) | INTRAMUSCULAR | Status: DC
  Filled 2015-04-27 (×4): qty 1

## 2015-04-27 MED ORDER — FENTANYL CITRATE (PF) 100 MCG/2ML IJ SOLN
INTRAMUSCULAR | Status: DC | PRN
  Administered 2015-04-27 (×2): 25 ug via INTRAVENOUS
  Administered 2015-04-27: 50 ug via INTRAVENOUS

## 2015-04-27 MED ORDER — NEOSTIGMINE METHYLSULFATE 10 MG/10ML IV SOLN
INTRAVENOUS | Status: DC | PRN
  Administered 2015-04-27: 3 mg via INTRAVENOUS

## 2015-04-27 MED ORDER — EPHEDRINE SULFATE 50 MG/ML IJ SOLN
INTRAMUSCULAR | Status: DC | PRN
  Administered 2015-04-27: 10 mg via INTRAVENOUS

## 2015-04-27 MED ORDER — LACTATED RINGERS IR SOLN
Status: DC | PRN
  Administered 2015-04-27: 3000 mL

## 2015-04-27 MED ORDER — SUCCINYLCHOLINE CHLORIDE 20 MG/ML IJ SOLN
INTRAMUSCULAR | Status: DC | PRN
  Administered 2015-04-27: 100 mg via INTRAVENOUS

## 2015-04-27 MED ORDER — OXYCODONE-ACETAMINOPHEN 5-325 MG PO TABS
1.0000 | ORAL_TABLET | ORAL | Status: DC | PRN
  Administered 2015-04-28: 1 via ORAL
  Filled 2015-04-27: qty 1

## 2015-04-27 MED ORDER — KETOROLAC TROMETHAMINE 30 MG/ML IJ SOLN
INTRAMUSCULAR | Status: DC | PRN
  Administered 2015-04-27: 30 mg via INTRAVENOUS

## 2015-04-27 MED ORDER — MIDAZOLAM HCL 5 MG/5ML IJ SOLN
INTRAMUSCULAR | Status: DC | PRN
  Administered 2015-04-27: 2 mg via INTRAVENOUS

## 2015-04-27 MED ORDER — CEFOTETAN DISODIUM-DEXTROSE 2-2.08 GM-% IV SOLR
INTRAVENOUS | Status: AC
  Filled 2015-04-27: qty 50

## 2015-04-27 MED ORDER — CLONAZEPAM 1 MG PO TABS
1.0000 mg | ORAL_TABLET | Freq: Two times a day (BID) | ORAL | Status: DC
Start: 2015-04-27 — End: 2015-04-28
  Administered 2015-04-28: 1 mg via ORAL
  Filled 2015-04-27 (×2): qty 1

## 2015-04-27 MED ORDER — WHITE PETROLATUM GEL
Status: AC
  Filled 2015-04-27: qty 5

## 2015-04-27 MED ORDER — LIDOCAINE HCL (CARDIAC) 20 MG/ML IV SOLN
INTRAVENOUS | Status: DC | PRN
  Administered 2015-04-27: 60 mg via INTRAVENOUS

## 2015-04-27 MED ORDER — DEXAMETHASONE SODIUM PHOSPHATE 4 MG/ML IJ SOLN
INTRAMUSCULAR | Status: DC | PRN
  Administered 2015-04-27: 10 mg via INTRAVENOUS

## 2015-04-27 SURGICAL SUPPLY — 65 items
BLADE SURG 10 STRL SS (BLADE) ×3 IMPLANT
BLADE SURG 11 STRL SS (BLADE) ×3 IMPLANT
CATH ROBINSON RED A/P 16FR (CATHETERS) ×3 IMPLANT
CLEANER CAUTERY TIP 5X5 PAD (MISCELLANEOUS) ×2 IMPLANT
COVER BACK TABLE 60X90IN (DRAPES) ×6 IMPLANT
COVER MAYO STAND STRL (DRAPES) ×6 IMPLANT
DRAPE LG THREE QUARTER DISP (DRAPES) ×3 IMPLANT
DRAPE UNDERBUTTOCKS STRL (DRAPE) ×3 IMPLANT
DRSG OPSITE POSTOP 3X4 (GAUZE/BANDAGES/DRESSINGS) ×3 IMPLANT
DRSG TEGADERM 2-3/8X2-3/4 SM (GAUZE/BANDAGES/DRESSINGS) IMPLANT
ELECT LIGASURE LONG (ELECTRODE) ×3 IMPLANT
ELECT LIGASURE SHORT 9 REUSE (ELECTRODE) ×3 IMPLANT
ELECT REM PT RETURN 9FT ADLT (ELECTROSURGICAL) ×3
ELECTRODE REM PT RTRN 9FT ADLT (ELECTROSURGICAL) ×2 IMPLANT
GLOVE BIO SURGEON STRL SZ 6.5 (GLOVE) ×6 IMPLANT
GLOVE BIO SURGEON STRL SZ7 (GLOVE) ×9 IMPLANT
GLOVE INDICATOR 6.5 STRL GRN (GLOVE) ×3 IMPLANT
GOWN STRL REUS W/ TWL LRG LVL3 (GOWN DISPOSABLE) ×4 IMPLANT
GOWN STRL REUS W/ TWL XL LVL3 (GOWN DISPOSABLE) ×4 IMPLANT
GOWN STRL REUS W/TWL LRG LVL3 (GOWN DISPOSABLE) ×2
GOWN STRL REUS W/TWL XL LVL3 (GOWN DISPOSABLE) ×2
HOLDER FOLEY CATH W/STRAP (MISCELLANEOUS) ×3 IMPLANT
IV LACTATED RINGER IRRG 3000ML (IV SOLUTION) ×1
IV LR IRRIG 3000ML ARTHROMATIC (IV SOLUTION) ×2 IMPLANT
KIT ROOM TURNOVER WOR (KITS) ×3 IMPLANT
LIQUID BAND (GAUZE/BANDAGES/DRESSINGS) ×3 IMPLANT
NEEDLE HYPO 25X1 1.5 SAFETY (NEEDLE) ×3 IMPLANT
NEEDLE INSUFFLATION 14GA 120MM (NEEDLE) ×3 IMPLANT
NEEDLE SPNL 22GX3.5 QUINCKE BK (NEEDLE) IMPLANT
NS IRRIG 500ML POUR BTL (IV SOLUTION) ×3 IMPLANT
PACK BASIN DAY SURGERY FS (CUSTOM PROCEDURE TRAY) ×3 IMPLANT
PACKING VAGINAL (PACKING) IMPLANT
PAD CLEANER CAUTERY TIP 5X5 (MISCELLANEOUS) ×1
PAD OB MATERNITY 4.3X12.25 (PERSONAL CARE ITEMS) ×3 IMPLANT
PENCIL BUTTON HOLSTER BLD 10FT (ELECTRODE) ×3 IMPLANT
SEALER TISSUE G2 CVD JAW 45CM (ENDOMECHANICALS) ×3 IMPLANT
SET IRRIG TUBING LAPAROSCOPIC (IRRIGATION / IRRIGATOR) ×3 IMPLANT
SHEET LAVH (DRAPES) ×3 IMPLANT
SOLUTION ANTI FOG 6CC (MISCELLANEOUS) ×3 IMPLANT
SPONGE GAUZE 2X2 8PLY STRL LF (GAUZE/BANDAGES/DRESSINGS) IMPLANT
SPONGE GAUZE 4X4 12PLY STER LF (GAUZE/BANDAGES/DRESSINGS) IMPLANT
SPONGE LAP 4X18 X RAY DECT (DISPOSABLE) ×3 IMPLANT
STRIP CLOSURE SKIN 1/2X4 (GAUZE/BANDAGES/DRESSINGS) ×3 IMPLANT
STRIP CLOSURE SKIN 1/4X3 (GAUZE/BANDAGES/DRESSINGS) IMPLANT
SUT MON AB 2-0 CT1 36 (SUTURE) ×6 IMPLANT
SUT VIC AB 0 CT1 18XCR BRD8 (SUTURE) ×4 IMPLANT
SUT VIC AB 0 CT1 36 (SUTURE) IMPLANT
SUT VIC AB 0 CT1 8-18 (SUTURE) ×2
SUT VIC AB 2-0 CT1 (SUTURE) IMPLANT
SUT VIC AB 2-0 UR6 27 (SUTURE) IMPLANT
SUT VICRYL 0 TIES 12 18 (SUTURE) ×3 IMPLANT
SUT VICRYL 0 UR6 27IN ABS (SUTURE) IMPLANT
SUT VICRYL 4-0 PS2 18IN ABS (SUTURE) ×3 IMPLANT
SYR BULB IRRIGATION 50ML (SYRINGE) ×3 IMPLANT
SYR CONTROL 10ML LL (SYRINGE) ×3 IMPLANT
SYRINGE 10CC LL (SYRINGE) ×6 IMPLANT
TOWEL OR 17X24 6PK STRL BLUE (TOWEL DISPOSABLE) ×6 IMPLANT
TRAY DSU PREP LF (CUSTOM PROCEDURE TRAY) ×3 IMPLANT
TRAY FOLEY CATH SILVER 14FR (SET/KITS/TRAYS/PACK) ×3 IMPLANT
TROCAR OPTI TIP 5M 100M (ENDOMECHANICALS) ×6 IMPLANT
TROCAR XCEL DIL TIP R 11M (ENDOMECHANICALS) ×3 IMPLANT
TUBE CONNECTING 12X1/4 (SUCTIONS) ×6 IMPLANT
TUBING INSUFFLATION 10FT LAP (TUBING) ×3 IMPLANT
WARMER LAPAROSCOPE (MISCELLANEOUS) ×3 IMPLANT
YANKAUER SUCT BULB TIP NO VENT (SUCTIONS) ×3 IMPLANT

## 2015-04-27 NOTE — Anesthesia Procedure Notes (Signed)
Procedure Name: Intubation Date/Time: 04/27/2015 7:35 AM Performed by: Denna Haggard D Pre-anesthesia Checklist: Patient identified, Emergency Drugs available, Suction available and Patient being monitored Patient Re-evaluated:Patient Re-evaluated prior to inductionOxygen Delivery Method: Circle System Utilized Preoxygenation: Pre-oxygenation with 100% oxygen Intubation Type: IV induction Ventilation: Mask ventilation without difficulty Laryngoscope Size: Mac and 3 Grade View: Grade I Tube type: Oral Tube size: 7.0 mm Number of attempts: 1 Airway Equipment and Method: Stylet and Oral airway Placement Confirmation: ETT inserted through vocal cords under direct vision,  positive ETCO2 and breath sounds checked- equal and bilateral Secured at: 21 cm Tube secured with: Tape Dental Injury: Teeth and Oropharynx as per pre-operative assessment

## 2015-04-27 NOTE — Transfer of Care (Signed)
Immediate Anesthesia Transfer of Care Note  Patient: [name]  Procedure(s) Performed: Procedure(s) (LRB): LAPAROSCOPIC ASSISTED VAGINAL HYSTERECTOMY (N/A) BILATERAL SALPINGECTOMY (Bilateral)  Patient Location: PACU  Anesthesia Type: General  Level of Consciousness: awake, oriented, sedated and patient cooperative  Airway & Oxygen Therapy: Patient Spontanous Breathing and Patient connected to face mask oxygen  Post-op Assessment: Report given to PACU RN and Post -op Vital signs reviewed and stable  Post vital signs: Reviewed and stable  Complications: No apparent anesthesia complications

## 2015-04-27 NOTE — Anesthesia Preprocedure Evaluation (Signed)
Anesthesia Evaluation  Patient identified by MRN, date of birth, ID band Patient awake    Reviewed: Allergy & Precautions, NPO status , Patient's Chart, lab work & pertinent test results  History of Anesthesia Complications (+) history of anesthetic complications  Airway Mallampati: II  TM Distance: >3 FB Neck ROM: Full    Dental  (+) Teeth Intact   Pulmonary neg shortness of breath, neg sleep apnea, neg COPD, neg recent URI, Current Smoker,    breath sounds clear to auscultation       Cardiovascular hypertension, Pt. on medications  Rhythm:Regular     Neuro/Psych PSYCHIATRIC DISORDERS Anxiety Depression negative neurological ROS     GI/Hepatic Neg liver ROS, GERD  Controlled,  Endo/Other  negative endocrine ROS  Renal/GU negative Renal ROS     Musculoskeletal negative musculoskeletal ROS (+)   Abdominal   Peds  Hematology  (+) anemia ,   Anesthesia Other Findings   Reproductive/Obstetrics                             Anesthesia Physical Anesthesia Plan  ASA: II  Anesthesia Plan: General   Post-op Pain Management:    Induction: Intravenous  Airway Management Planned: Oral ETT  Additional Equipment: None  Intra-op Plan:   Post-operative Plan: Extubation in OR  Informed Consent: I have reviewed the patients History and Physical, chart, labs and discussed the procedure including the risks, benefits and alternatives for the proposed anesthesia with the patient or authorized representative who has indicated his/her understanding and acceptance.   Dental advisory given  Plan Discussed with:   Anesthesia Plan Comments: (Facial swelling with tongue numbness and hoarseness after last anesthetic which self resolved)        Anesthesia Quick Evaluation

## 2015-04-27 NOTE — Op Note (Signed)
Preoperative diagnosis: Abnormal uterine bleeding, leiomyoma  Postoperative diagnosis: Same  Procedure: LAVH, bilateral salpingectomy  Surgeon: Matthew Saras  Asst.: McComb  EBL: 1 50 cc  Specimens removed: Uterus, bilateral tubes, to pathology  Procedure and findings:  The patient taken the operating room after an adequate level of general anesthesia was obtained with the patient's leg stirrups the abdomen perineum and vagina were prepped and draped in normal fashion the bladder was drained, EUA was carried out the uterus was midposition upper limit of normal size, mobile, adnexa negative. Hulka tenaculum was positioned. Appropriate timeouts were taken at this point. Attention directed to the abdomen where the subumbilical area was infiltrated with quarter percent Marcaine plain small incision was made in the varies needle was introduced without difficulty. Its intra-abdominal position was verified by pressure water testing. After 2-1/2 L pneumoperitoneum syncopated, lap scopic trocar and sleeve were then introduced without difficulty. There was no evidence of any bleeding or trauma. 3 finger breaths above the symphysis in the midline a 5 mm trocar was inserted under direct visualization.  The patient in Trendelenburg the pelvic findings as follows:  The uterus itself was irregular at the left fundus from her known fibroid was otherwise mobile adnexa cul-de-sac anterior space upper abdomen appendix otherwise unremarkable. Using the in seal device, the right tube was placed on traction toward the midline with an atraumatic grasper the mesosalpinx was divided up to the uterine attachment, the utero-ovarian pedicle was then coagulate and divided down to and including the round ligament this area was hemostatic the ovary was thus conserved.  Exact same repeated on the opposite side dissecting free the tube. Once this was completed the vaginal portion the procedure was started   Legs were extended weighted  speculum was positioned, cervix grasped with tenaculum cervical vaginal mucosa was incised posterior culdotomy performed without difficulty. Anteriorly I dissection was carried up until the anterior reflection could be seen this was entered sharply and a retractor then used to gently elevate the bladder out of the field. In sequential manner the uterosacral ligament, cardinal ligament, uterine vasculature pedicle and upper broad ligament pedicles were clamped divided and suture ligated with 0 Vicryl. The fundus of the uterus is in delivered posteriorly remaining pedicles were clamped divided and free tie with 0 Vicryl suture. Vaginal cuff was then closed from 3 to 9:00 with a running locked 2-0 Vicryl suture. Prior to closure sponge, needle, history counts reported correct 2. Vaginal cuff was then closed right to left with interrupted 2-0 Vicryl sutures. This was hemostatic Foley catheter positioned draining clear urine. Repeat laparoscopy and irrigation with the Nezhat was then carried out the operative site was inspected after irrigation and aspiration all operative sites noted be hemostatic even at reduced pressure. Instruments removed, gas allowed to escape the upper incision closed with 4-0 subcuticular and Dermabond on the lower. She tolerated this well went to recovery room in good condition.  Dictated with SavonaD.

## 2015-04-27 NOTE — Progress Notes (Signed)
The patient was re-examined with no change in status 

## 2015-04-28 ENCOUNTER — Encounter (HOSPITAL_BASED_OUTPATIENT_CLINIC_OR_DEPARTMENT_OTHER): Payer: Self-pay | Admitting: Obstetrics and Gynecology

## 2015-04-28 DIAGNOSIS — N920 Excessive and frequent menstruation with regular cycle: Secondary | ICD-10-CM | POA: Diagnosis not present

## 2015-04-28 LAB — CBC
HCT: 27.8 % — ABNORMAL LOW (ref 36.0–46.0)
HEMOGLOBIN: 9.4 g/dL — AB (ref 12.0–15.0)
MCH: 34.3 pg — ABNORMAL HIGH (ref 26.0–34.0)
MCHC: 33.8 g/dL (ref 30.0–36.0)
MCV: 101.5 fL — ABNORMAL HIGH (ref 78.0–100.0)
PLATELETS: 233 10*3/uL (ref 150–400)
RBC: 2.74 MIL/uL — ABNORMAL LOW (ref 3.87–5.11)
RDW: 13.1 % (ref 11.5–15.5)
WBC: 9.6 10*3/uL (ref 4.0–10.5)

## 2015-04-28 MED ORDER — OXYCODONE-ACETAMINOPHEN 5-325 MG PO TABS: 1.0000 | ORAL_TABLET | ORAL | Status: DC | PRN

## 2015-04-28 MED ORDER — IBUPROFEN 800 MG PO TABS: 800.0000 mg | ORAL_TABLET | Freq: Three times a day (TID) | ORAL | Status: DC | PRN

## 2015-04-28 NOTE — Anesthesia Postprocedure Evaluation (Signed)
Anesthesia Post Note  Patient: [name]  Procedure(s) Performed: Procedure(s) (LRB): LAPAROSCOPIC ASSISTED VAGINAL HYSTERECTOMY (N/A) BILATERAL SALPINGECTOMY (Bilateral)  Patient location during evaluation: PACU Anesthesia Type: General Level of consciousness: awake Pain management: pain level controlled Vital Signs Assessment: post-procedure vital signs reviewed and stable Respiratory status: spontaneous breathing Cardiovascular status: stable Postop Assessment: no signs of nausea or vomiting Anesthetic complications: no    Last Vitals:  Filed Vitals:   04/28/15 0559 04/28/15 0952  BP: 113/74 105/55  Pulse: 73 71  Temp: 37 C   Resp: 16     Last Pain:  Filed Vitals:   04/28/15 0954  PainSc: 3                  Hokulani Rogel

## 2015-04-28 NOTE — Care Management Note (Signed)
Case Management Note  Patient Details  Name: [name] MRN: [id number] Date of Birth: 07/01/1965  Subjective/Objective:                    Action/Plan: Anticipate discharge home today. No further CM needs but will be available should additional discharge needs arise.   Expected Discharge Date:                  Expected Discharge Plan:     In-House Referral:     Discharge planning Services  CM Consult  Post Acute Care Choice:    Choice offered to:     DME Arranged:    DME Agency:     HH Arranged:    [place] Agency:     Status of Service:  Completed, signed off  Medicare Important Message Given:    Date Medicare IM Given:    Medicare IM give by:    Date Additional Medicare IM Given:    Additional Medicare Important Message give by:     If discussed at [place] of Stay Meetings, dates discussed:    Additional Comments:  Delrae Sawyers, RN 04/28/2015, 9:45 AM

## 2015-04-28 NOTE — Discharge Summary (Signed)
Physician Discharge Summary  Patient ID: [name] MRN: [id number] DOB/AGE: 52-Oct-1967 52 y.o.  Admit date: 04/27/2015 Discharge date: 04/28/2015  Admission Diagnoses:menorrhagia/leiomyoma  Discharge Diagnoses: same Active Problems:   Leiomyoma   Discharged Condition: good  Hospital Course: adm for LAVH+BS, D/C on POD1 afeb, tol PO  Consults: None  Significant Diagnostic Studies: labs:  CBC    Component Value Date/Time   WBC 9.6 04/28/2015 0511   RBC 2.74* 04/28/2015 0511   HGB 9.4* 04/28/2015 0511   HCT 27.8* 04/28/2015 0511   PLT 233 04/28/2015 0511   MCV 101.5* 04/28/2015 0511   MCH 34.3* 04/28/2015 0511   MCHC 33.8 04/28/2015 0511   RDW 13.1 04/28/2015 0511      Treatments: surgery: LAVH, BS   Discharge Exam: Blood pressure 113/74, pulse 73, temperature 98.6 F (37 C), temperature source Oral, resp. rate 16, height 4' 11.5" (1.511 m), weight 128 lb 8 oz (58.287 kg), last menstrual period 04/20/2015, SpO2 97 %. ABD SOFT +bs  Disposition: 01-Home or Self Care     Medication List    TAKE these medications        ARIPiprazole 5 MG tablet  Commonly known as:  ABILIFY  Take 5 mg by mouth every evening.     clonazePAM 1 MG tablet  Commonly known as:  KLONOPIN  Take 1 mg by mouth 2 (two) times daily.     DULCOLAX PO  Take 3 tablets by mouth as needed (constipation).     ferrous sulfate 325 (65 FE) MG tablet  Take 325 mg by mouth daily with breakfast.     ibuprofen 800 MG tablet  Commonly known as:  ADVIL,MOTRIN  Take 1 tablet (800 mg total) by mouth every 8 (eight) hours as needed for moderate pain (mild pain).     lisinopril 20 MG tablet  Commonly known as:  PRINIVIL,ZESTRIL  Take 20 mg by mouth every morning.     oxyCODONE-acetaminophen 5-325 MG tablet  Commonly known as:  PERCOCET/ROXICET  Take 1-2 tablets by mouth every 4 (four) hours as needed for severe pain (moderate to severe pain (when tolerating fluids)).     PEPCID COMPLETE PO  Take  by mouth as needed (reflux).     propranolol 10 MG tablet  Commonly known as:  INDERAL  Take 10 mg by mouth 2 (two) times daily.     venlafaxine XR 150 MG 24 hr capsule  Commonly known as:  EFFEXOR-XR  Take 300 mg by mouth every evening.           Follow-up Information    Follow up with Margarette Asal, MD. Schedule an appointment as soon as possible for a visit in 1 week.   Specialty:  Obstetrics and Gynecology   Contact information:   [address] [place] [address] [phone]       Signed: Margarette Asal 04/28/2015, 7:28 AM

## 2018-06-03 ENCOUNTER — Other Ambulatory Visit: Payer: Self-pay | Admitting: Internal Medicine

## 2018-06-03 DIAGNOSIS — Z1231 Encounter for screening mammogram for malignant neoplasm of breast: Secondary | ICD-10-CM

## 2018-06-28 ENCOUNTER — Ambulatory Visit: Payer: PRIVATE HEALTH INSURANCE

## 2019-02-12 ENCOUNTER — Other Ambulatory Visit: Payer: Self-pay

## 2019-02-12 DIAGNOSIS — Z20822 Contact with and (suspected) exposure to covid-19: Secondary | ICD-10-CM

## 2019-02-13 LAB — NOVEL CORONAVIRUS, NAA: SARS-CoV-2, NAA: NOT DETECTED

## 2019-08-27 ENCOUNTER — Other Ambulatory Visit: Payer: Self-pay | Admitting: Internal Medicine

## 2019-08-27 DIAGNOSIS — I10 Essential (primary) hypertension: Secondary | ICD-10-CM

## 2019-08-27 DIAGNOSIS — Z9189 Other specified personal risk factors, not elsewhere classified: Secondary | ICD-10-CM

## 2019-08-27 DIAGNOSIS — F17210 Nicotine dependence, cigarettes, uncomplicated: Secondary | ICD-10-CM

## 2019-09-18 ENCOUNTER — Other Ambulatory Visit: Payer: PRIVATE HEALTH INSURANCE

## 2020-09-16 ENCOUNTER — Other Ambulatory Visit: Payer: Self-pay | Admitting: Internal Medicine

## 2020-09-16 DIAGNOSIS — Z9189 Other specified personal risk factors, not elsewhere classified: Secondary | ICD-10-CM

## 2020-12-30 ENCOUNTER — Other Ambulatory Visit: Payer: Self-pay | Admitting: Neurological Surgery

## 2020-12-30 DIAGNOSIS — M4722 Other spondylosis with radiculopathy, cervical region: Secondary | ICD-10-CM

## 2021-01-13 ENCOUNTER — Ambulatory Visit
Admission: RE | Admit: 2021-01-13 | Discharge: 2021-01-13 | Disposition: A | Discharge status: Home / Self Care | Payer: Managed Care, Other (non HMO) | Source: Ambulatory Visit | Attending: Neurological Surgery | Admitting: Neurological Surgery

## 2021-01-13 ENCOUNTER — Other Ambulatory Visit: Payer: Self-pay

## 2021-01-13 DIAGNOSIS — M4722 Other spondylosis with radiculopathy, cervical region: Secondary | ICD-10-CM

## 2021-05-02 ENCOUNTER — Encounter: Payer: Self-pay | Admitting: Gastroenterology

## 2021-05-30 ENCOUNTER — Other Ambulatory Visit (INDEPENDENT_AMBULATORY_CARE_PROVIDER_SITE_OTHER): Payer: Managed Care, Other (non HMO)

## 2021-05-30 ENCOUNTER — Ambulatory Visit: Payer: Managed Care, Other (non HMO) | Admitting: Gastroenterology

## 2021-05-30 ENCOUNTER — Encounter: Payer: Self-pay | Admitting: Gastroenterology

## 2021-05-30 VITALS — BP 121/60 | HR 87 | Ht 59.0 in | Wt 118.0 lb

## 2021-05-30 DIAGNOSIS — R197 Diarrhea, unspecified: Secondary | ICD-10-CM | POA: Diagnosis not present

## 2021-05-30 DIAGNOSIS — Z800 Family history of malignant neoplasm of digestive organs: Secondary | ICD-10-CM | POA: Diagnosis not present

## 2021-05-30 LAB — BASIC METABOLIC PANEL
BUN: 6 mg/dL (ref 6–23)
CO2: 30 mEq/L (ref 19–32)
Calcium: 9.2 mg/dL (ref 8.4–10.5)
Chloride: 99 mEq/L (ref 96–112)
Creatinine, Ser: 0.72 mg/dL (ref 0.40–1.20)
GFR: 92.76 mL/min (ref >60.00)
Glucose, Bld: 88 mg/dL (ref 70–99)
Potassium: 4.2 mEq/L (ref 3.5–5.1)
Sodium: 136 mEq/L (ref 135–145)

## 2021-05-30 LAB — TSH: TSH: 0.69 u[IU]/mL (ref 0.35–5.50)

## 2021-05-30 MED ORDER — NA SULFATE-K SULFATE-MG SULF 17.5-3.13-1.6 GM/177ML PO SOLN: 1.0000 | Freq: Once | ORAL | 0 refills | Status: AC

## 2021-05-30 NOTE — Progress Notes (Addendum)
Referring Provider: Shon Baton, MD Primary Care Physician:  Shon Baton, MD  Reason for Consultation:  Colonoscopy, diarrhea   IMPRESSION:  Change in bowel habits - now with chronic diarrhea. She feels the diarrhea may be due to anxiety. Certainly functional diarrhea and side effects of medications such as Abilify must be consider. However, organic GI disease must be excluded.   Colonoscopy with fair prep 2016. Colonoscopy with 2 day prep now   Father with colon cancer at age 81. Surveillance colonoscopy due.    PLAN: - GI pathogen panel, fecal calprotectin, fecal elastase - TSH - BMP to evaluate for electrolyte abnormalities related to diarrhea - Reviewed diet recommendations as outlined in patient instructions - Colonoscopy with right and left sided biopsies  I consented the patient today discussing how the procedure is performed as well as potential risks and benefits. She would like to proceed.    HPI: Dabney Dever is a 56 y.o. female referred by Dr. Virgina Jock for diarrhea. She has anxiety, arthritis, depression, high blood pressure, and a prior history of BIS. Having liquid and loose stools 3-5 times every morning over the last several years. No blood or mucous in the stool. No nocturnal symptoms. She thinks it might be anxiety related. Her sister has chronic diarrhea related to anxiety, as well.  Denies a precipitating event, trauma, close contacts with similar symptoms, changes in diet, recent travel or antibiotic use.  She will use Klonopin when she goes out with good relief. She has not needed to use PeptoBismal, Imodium, or other OTC diarrhea treatments. Tried probiotics without change. Uses Activa sometimes. Weight is stable. Good appetite.   Colonoscopy with Dr. Maurene Capes 10/02/2014 showed a 3 mm sigmoid hyperplastic polyp. Prep quality was fair.  No difficulty with the prep, procedure, or recovery at that time.   Father with colon cancer at age 22. There is no other known  family history of colon cancer or polyps. No family history of stomach cancer or other GI malignancy. No family history of inflammatory bowel disease or celiac.    Past Medical History:  Diagnosis Date   Abnormal uterine bleeding (AUB)    Anemia    Anxiety    Complication of anesthesia    w/  11-12-2014 at [facility] pt states one side of tongue numb, facial swelling , and sob next day after sugery   Depression    GERD (gastroesophageal reflux disease)    takes pepcid as needed    Hypertension    Uterine fibroid    Wears contact lenses     Past Surgical History:  Procedure Laterality Date   BILATERAL SALPINGECTOMY Bilateral 04/27/2015   Procedure: BILATERAL SALPINGECTOMY;  Surgeon: Molli Posey, MD;  Location: [facility];  Service: Gynecology;  Laterality: Bilateral;   COLONOSCOPY  June 2016   DILATATION & CURETTAGE/HYSTEROSCOPY WITH MYOSURE N/A 11/12/2014   Procedure: DILATATION & CURETTAGE/HYSTEROSCOPY;  Surgeon: Molli Posey, MD;  Location: [facility];  Service: Gynecology;  Laterality: N/A;   LAPAROSCOPIC ASSISTED VAGINAL HYSTERECTOMY N/A 04/27/2015   Procedure: LAPAROSCOPIC ASSISTED VAGINAL HYSTERECTOMY;  Surgeon: Molli Posey, MD;  Location: [facility];  Service: Gynecology;  Laterality: N/A;   TONSILLECTOMY  1977   WISDOM TOOTH EXTRACTION  1985    Current Outpatient Medications  Medication Sig Dispense Refill   amLODipine (NORVASC) 5 MG tablet Take 5 mg by mouth daily.     ARIPiprazole (ABILIFY) 5 MG tablet Take 5 mg by mouth every evening.  clonazePAM (KLONOPIN) 1 MG tablet Take 1 mg by mouth 2 (two) times daily.     fluticasone (FLONASE) 50 MCG/ACT nasal spray Place into both nostrils daily.     furosemide (LASIX) 20 MG tablet Take 20 mg by mouth.     gabapentin (NEURONTIN) 300 MG capsule Take 300 mg by mouth 3 (three) times daily.     lisinopril (PRINIVIL,ZESTRIL) 20 MG tablet Take 20 mg by mouth every morning.      ondansetron (ZOFRAN) 4  MG tablet Take 4 mg by mouth every 8 (eight) hours as needed for nausea or vomiting.     potassium citrate (UROCIT-K) 10 MEQ (1080 MG) SR tablet Take 10 mEq by mouth daily.     venlafaxine XR (EFFEXOR-XR) 150 MG 24 hr capsule Take 300 mg by mouth every evening.      Vitamin D, Ergocalciferol, (DRISDOL) 1.25 MG (50000 UNIT) CAPS capsule Take 50,000 Units by mouth every 7 (seven) days.     No current facility-administered medications for this visit.    Allergies as of 05/30/2021   (No Known Allergies)    Family History  Problem Relation Age of Onset   Colon cancer Father 48    Social History   Socioeconomic History   Marital status: Divorced    Spouse name: Not on file   Number of children: Not on file   Years of education: Not on file   Highest education level: Not on file  Occupational History   Occupation: retired  Tobacco Use   Smoking status: Every Day    Packs/day: 0.50    Years: 3.00    Pack years: 1.50    Types: Cigarettes   Smokeless tobacco: Never  Substance and Sexual Activity   Alcohol use: Yes    Alcohol/week: 2.0 standard drinks    Types: 2 Standard drinks or equivalent per week    Comment: socially   Drug use: No   Sexual activity: Not on file  Other Topics Concern   Not on file  Social History Narrative   Not on file   Social Determinants of Health   Financial Resource Strain: Not on file  Food Insecurity: Not on file  Transportation Needs: Not on file  Physical Activity: Not on file  Stress: Not on file  Social Connections: Not on file  Intimate Partner Violence: Not on file    Review of Systems: 12 system ROS is negative except as noted above with the addition of anxiety, arthritis, back pain, depression, fatigue, and muscle pains.   Physical Exam: General:   Alert,  well-nourished, pleasant and cooperative in NAD Head:  Normocephalic and atraumatic. Eyes:  Sclera clear, no icterus.   Conjunctiva pink. Ears:  Normal auditory  acuity. Nose:  No deformity, discharge,  or lesions. Mouth:  No deformity or lesions.   Neck:  Supple; no masses or thyromegaly. Lungs:  Clear throughout to auscultation.   No wheezes. Heart:  Regular rate and rhythm; no murmurs. Abdomen:  Soft, nontender, nondistended, normal bowel sounds, no rebound or guarding. No hepatosplenomegaly.   Rectal:  Deferred  Msk:  Symmetrical. No boney deformities LAD: No inguinal or umbilical LAD Extremities:  No clubbing or edema. Neurologic:  Alert and  oriented x4;  grossly nonfocal Skin:  Intact without significant lesions or rashes. Psych:  Alert and cooperative. Normal mood and affect.      Michaelangelo Mittelman L. Tarri Glenn, MD, MPH 05/31/2021, 6:52 AM

## 2021-05-30 NOTE — Patient Instructions (Addendum)
It was my pleasure to provide care to you today. Based on our discussion, I am providing you with my recommendations below:  RECOMMENDATION(S):   Avoid high fat foods, as they can make diarrhea worse.   Dairy products (except yogurt) may be difficult to digest when you have diarrhea. I recommend that you temporarily avoid lactose-containing foods.   Carbonated beverages and sugar substitutes may cause or worsen diarrhea.   If needed, you could uses loperamide of PeptoBismal.  A daily probiotic may be very helpful.  LABS:   Please proceed to the basement level for lab work before leaving today. Press "B" on the elevator. The lab is located at the first door on the left as you exit the elevator.  COLONOSCOPY:   You have been scheduled for a colonoscopy. Please follow written instructions given to you at your visit today.   PREP:   Please pick up your prep supplies at the pharmacy within the next 1-3 days.  INHALERS:   If you use inhalers (even only as needed), please bring them with you on the day of your procedure.   COLONOSCOPY TIPS:  To reduce nausea and dehydration, stay well hydrated for 3-4 days prior to the exam.  To prevent skin/hemorrhoid irritation - prior to wiping, put A&Dointment or vaseline on the toilet paper. Keep a towel or pad on the bed.  BEFORE STARTING YOUR PREP, drink  64oz of clear liquids in the morning. This will help to flush the colon and will ensure you are well hydrated!!!!  NOTE - This is in addition to the fluids required for to complete your prep. Use of a flavored hard candy, such as grape Anise Salvo, can counteract some of the flavor of the prep and may prevent some nausea.    FOLLOW UP:  After your procedure, you will receive a call from my office staff regarding my recommendation for follow up.  BMI:  If you are age 10 or older, your body mass index should be between 23-30. Your Body mass index is 23.83 kg/m. If this is out of the  aforementioned range listed, please consider follow up with your Primary Care Provider.  If you are age 77 or younger, your body mass index should be between 19-25. Your Body mass index is 23.83 kg/m. If this is out of the aformentioned range listed, please consider follow up with your Primary Care Provider.   MY CHART:  The [facility] providers would like to encourage you to use [facility] to communicate with providers for non-urgent requests or questions.  Due to long hold times on the telephone, sending your provider a message by [facility] may be a faster and more efficient way to get a response.  Please allow 48 business hours for a response.  Please remember that this is for non-urgent requests.   Thank you for trusting me with your gastrointestinal care!    Thornton Park, MD, MPH

## 2021-05-31 ENCOUNTER — Encounter: Payer: Self-pay | Admitting: Gastroenterology

## 2021-07-04 ENCOUNTER — Encounter: Payer: Self-pay | Admitting: Gastroenterology

## 2021-07-07 ENCOUNTER — Other Ambulatory Visit: Payer: Self-pay | Admitting: Gastroenterology

## 2021-07-07 ENCOUNTER — Ambulatory Visit (AMBULATORY_SURGERY_CENTER): Payer: Managed Care, Other (non HMO) | Admitting: Gastroenterology

## 2021-07-07 ENCOUNTER — Encounter: Payer: Self-pay | Admitting: Gastroenterology

## 2021-07-07 ENCOUNTER — Other Ambulatory Visit: Payer: Self-pay

## 2021-07-07 VITALS — BP 95/55 | HR 74 | Temp 96.2°F | Resp 14 | Ht 59.0 in | Wt 118.0 lb

## 2021-07-07 DIAGNOSIS — R197 Diarrhea, unspecified: Secondary | ICD-10-CM

## 2021-07-07 DIAGNOSIS — R194 Change in bowel habit: Secondary | ICD-10-CM

## 2021-07-07 DIAGNOSIS — K5730 Diverticulosis of large intestine without perforation or abscess without bleeding: Secondary | ICD-10-CM | POA: Diagnosis not present

## 2021-07-07 DIAGNOSIS — Z800 Family history of malignant neoplasm of digestive organs: Secondary | ICD-10-CM

## 2021-07-07 MED ORDER — SODIUM CHLORIDE 0.9 % IV SOLN: 500.0000 mL | Freq: Once | INTRAVENOUS | Status: DC

## 2021-07-07 NOTE — Patient Instructions (Signed)
Resume previous diet and medications. Awaiting pathology results. Repeat Colonoscopy in 5 years for surveillance. ? ?YOU HAD AN ENDOSCOPIC PROCEDURE TODAY AT [facility]:   Refer to the procedure report that was given to you for any specific questions about what was found during the examination.  If the procedure report does not answer your questions, please call your gastroenterologist to clarify.  If you requested that your care partner not be given the details of your procedure findings, then the procedure report has been included in a sealed envelope for you to review at your convenience later. ? ?YOU SHOULD EXPECT: Some feelings of bloating in the abdomen. Passage of more gas than usual.  Walking can help get rid of the air that was put into your GI tract during the procedure and reduce the bloating. If you had a lower endoscopy (such as a colonoscopy or flexible sigmoidoscopy) you may notice spotting of blood in your stool or on the toilet paper. If you underwent a bowel prep for your procedure, you may not have a normal bowel movement for a few days. ? ?Please Note:  You might notice some irritation and congestion in your nose or some drainage.  This is from the oxygen used during your procedure.  There is no need for concern and it should clear up in a day or so. ? ?SYMPTOMS TO REPORT IMMEDIATELY: ? ?Following lower endoscopy (colonoscopy or flexible sigmoidoscopy): ? Excessive amounts of blood in the stool ? Significant tenderness or worsening of abdominal pains ? Swelling of the abdomen that is new, acute ? Fever of 100?F or higher ? ?For urgent or emergent issues, a gastroenterologist can be reached at any hour by calling [phone]. ?Do not use MyChart messaging for urgent concerns.  ? ? ?DIET:  We do recommend a small meal at first, but then you may proceed to your regular diet.  Drink plenty of fluids but you should avoid alcoholic beverages for 24 hours. ? ?ACTIVITY:  You should  plan to take it easy for the rest of today and you should NOT DRIVE or use heavy machinery until tomorrow (because of the sedation medicines used during the test).   ? ?FOLLOW UP: ?Our staff will call the number listed on your records 48-72 hours following your procedure to check on you and address any questions or concerns that you may have regarding the information given to you following your procedure. If we do not reach you, we will leave a message.  We will attempt to reach you two times.  During this call, we will ask if you have developed any symptoms of COVID 19. If you develop any symptoms (ie: fever, flu-like symptoms, shortness of breath, cough etc.) before then, please call [phone].  If you test positive for Covid 19 in the 2 weeks post procedure, please call and report this information to [place].   ? ?If any biopsies were taken you will be contacted by phone or by letter within the next 1-3 weeks.  Please call us at [phone] if you have not heard about the biopsies in 3 weeks.  ? ? ?SIGNATURES/CONFIDENTIALITY: ?You and/or your care partner have signed paperwork which will be entered into your electronic medical record.  These signatures attest to the fact that that the information above on your After Visit Summary has been reviewed and is understood.  Full responsibility of the confidentiality of this discharge information lies with you and/or your care-partner.  ?

## 2021-07-07 NOTE — Progress Notes (Signed)
VSS, transported to PACU °

## 2021-07-07 NOTE — Progress Notes (Signed)
Pt's states no medical or surgical changes since previsit or office visit.  ° °VS DT °

## 2021-07-07 NOTE — Progress Notes (Signed)
Called to room to assist during endoscopic procedure.  Patient ID and intended procedure confirmed with present staff. Received instructions for my participation in the procedure from the performing physician.  

## 2021-07-07 NOTE — Progress Notes (Signed)
? ?Referring Provider: Shon Baton, MD ?Primary Care Physician:  Shon Baton, MD ? ?Indication for Procedure:  Change in bowel habits, diarrhea ? ? ?IMPRESSION:  ?Change in bowel habits - now with chronic diarrhea. She feels the diarrhea may be due to anxiety. Certainly functional diarrhea and side effects of medications such as Abilify must be consider. However, organic GI disease must be excluded.  ? ?Colonoscopy with fair prep 2016. Colonoscopy with 2 day prep now  ? ?Father with colon cancer at age 4. Surveillance colonoscopy due.  ? ? ?PLAN: ?Colonoscopy with right and left sided biopsies ? ? ? ?HPI: [name] is a 56 y.o. female who presents for colonoscopy to evaluate diarrhea. She has anxiety, arthritis, depression, high blood pressure, and a prior history of BIS. Having liquid and loose stools 3-5 times every morning over the last several years. No blood or mucous in the stool. No nocturnal symptoms. She thinks it might be anxiety related. Her sister has chronic diarrhea related to anxiety, as well.  Denies a precipitating event, trauma, close contacts with similar symptoms, changes in diet, recent travel or antibiotic use.  She will use Klonopin when she goes out with good relief. She has not needed to use PeptoBismal, Imodium, or other OTC diarrhea treatments. Tried probiotics without change. Uses Activa sometimes. Weight is stable. Good appetite.  ? ?Colonoscopy with Dr. Maurene Capes 10/02/2014 showed a 3 mm sigmoid hyperplastic polyp. Prep quality was fair.  ?No difficulty with the prep, procedure, or recovery at that time.  ? ?Father with colon cancer at age 59. There is no other known family history of colon cancer or polyps. No family history of stomach cancer or other GI malignancy. No family history of inflammatory bowel disease or celiac.  ? ? ?Past Medical History:  ?Diagnosis Date  ? Abnormal uterine bleeding (AUB)   ? Anemia   ? Anxiety   ? Complication of anesthesia   ? w/  11-12-2014 at [facility] pt  states one side of tongue numb, facial swelling , and sob next day after sugery  ? Depression   ? GERD (gastroesophageal reflux disease)   ? takes pepcid as needed   ? Hypertension   ? Uterine fibroid   ? Wears contact lenses   ? ? ?Past Surgical History:  ?Procedure Laterality Date  ? BILATERAL SALPINGECTOMY Bilateral 04/27/2015  ? Procedure: BILATERAL SALPINGECTOMY;  Surgeon: Molli Posey, MD;  Location: [facility];  Service: Gynecology;  Laterality: Bilateral;  ? COLONOSCOPY  09/23/2014  ? COLONOSCOPY WITH PROPOFOL  07/07/2021  ? diarrhea- [place]  ? DILATATION & CURETTAGE/HYSTEROSCOPY WITH MYOSURE N/A 11/12/2014  ? Procedure: DILATATION & CURETTAGE/HYSTEROSCOPY;  Surgeon: Molli Posey, MD;  Location: [facility];  Service: Gynecology;  Laterality: N/A;  ? LAPAROSCOPIC ASSISTED VAGINAL HYSTERECTOMY N/A 04/27/2015  ? Procedure: LAPAROSCOPIC ASSISTED VAGINAL HYSTERECTOMY;  Surgeon: Molli Posey, MD;  Location: [facility];  Service: Gynecology;  Laterality: N/A;  ? TONSILLECTOMY  04/25/1975  ? WISDOM TOOTH EXTRACTION  04/25/1983  ? ? ?Current Outpatient Medications  ?Medication Sig Dispense Refill  ? amLODipine (NORVASC) 5 MG tablet Take 5 mg by mouth daily.    ? ARIPiprazole (ABILIFY) 10 MG tablet Take 10 mg by mouth daily.    ? calcium carbonate (TUMS - DOSED IN MG ELEMENTAL CALCIUM) 500 MG chewable tablet 1 tablet    ? Cholecalciferol (VITAMIN D3) 1.25 MG (50000 UT) CAPS Take 1 capsule by mouth every 14 (fourteen) days.    ?  clonazePAM (KLONOPIN) 1 MG tablet Take 1 mg by mouth 2 (two) times daily.    ? cyclobenzaprine (FLEXERIL) 5 MG tablet Take 5 mg by mouth 3 (three) times daily.    ? furosemide (LASIX) 20 MG tablet Take 20 mg by mouth.    ? lisinopril (PRINIVIL,ZESTRIL) 20 MG tablet Take 20 mg by mouth every morning.     ? potassium chloride (KLOR-CON) 10 MEQ tablet Take 10 mEq by mouth daily.    ? venlafaxine XR (EFFEXOR-XR) 150 MG 24 hr capsule Take 300 mg by  mouth every evening.     ? fluticasone (FLONASE) 50 MCG/ACT nasal spray Place into both nostrils daily.    ? gabapentin (NEURONTIN) 300 MG capsule Take 300 mg by mouth 3 (three) times daily.    ? ondansetron (ZOFRAN-ODT) 4 MG disintegrating tablet Take 4 mg by mouth 2 (two) times daily as needed.    ? TRINTELLIX 10 MG TABS tablet Take 10 mg by mouth daily. (Patient not taking: Reported on 07/07/2021)    ? venlafaxine XR (EFFEXOR-XR) 37.5 MG 24 hr capsule Take 37.5 mg by mouth 2 (two) times daily. (Patient not taking: Reported on 07/07/2021)    ? ?Current Facility-Administered Medications  ?Medication Dose Route Frequency Provider Last Rate Last Admin  ? 0.9 %  sodium chloride infusion  500 mL Intravenous Once Thornton Park, MD      ? ? ?Allergies as of 07/07/2021  ? (No Known Allergies)  ? ? ?Family History  ?Problem Relation Age of Onset  ? Colon cancer Father 63  ? ? ? ? ?Physical Exam: ?General:   Alert,  well-nourished, pleasant and cooperative in NAD ?Head:  Normocephalic and atraumatic. ?Eyes:  Sclera clear, no icterus.   Conjunctiva pink. ?Ears:  Normal auditory acuity. ?Nose:  No deformity, discharge,  or lesions. ?Mouth:  No deformity or lesions.   ?Neck:  Supple; no masses or thyromegaly. ?Lungs:  Clear throughout to auscultation.   No wheezes. ?Heart:  Regular rate and rhythm; no murmurs. ?Abdomen:  Soft, nontender, nondistended, normal bowel sounds, no rebound or guarding. No hepatosplenomegaly.   ?Rectal:  Deferred  ?Msk:  Symmetrical. No boney deformities ?LAD: No inguinal or umbilical LAD ?Extremities:  No clubbing or edema. ?Neurologic:  Alert and  oriented x4;  grossly nonfocal ?Skin:  Intact without significant lesions or rashes. ?Psych:  Alert and cooperative. Normal mood and affect. ? ? ? ? ? ?Sibley Rolison L. Tarri Glenn, MD, MPH ?07/07/2021, 1:13 PM ? ? ? ?  ?

## 2021-07-07 NOTE — Op Note (Signed)
[place] ?Patient Name: [name] ?Procedure Date: 07/07/2021 1:33 PM ?MRN: [id number] ?Endoscopist: Thornton Park MD, MD ?Age: 56 ?Referring MD:  ?Date of Birth: February 10, 1966 ?Gender: Female ?Account #: [id number] ?Procedure:                Colonoscopy ?Indications:              Change in bowel habits, Clinically significant  ?                          diarrhea of unexplained origin ?                          Colonoscopy with Dr. Maurene Capes 10/02/2014 showed a 3 mm  ?                          sigmoid hyperplastic polyp. Prep quality was fair. ?                          Father with colon cancer at age 64 ?Medicines:                Monitored Anesthesia Care ?Procedure:                Pre-Anesthesia Assessment: ?                          - Prior to the procedure, a History and Physical  ?                          was performed, and patient medications and  ?                          allergies were reviewed. The patient's tolerance of  ?                          previous anesthesia was also reviewed. The risks  ?                          and benefits of the procedure and the sedation  ?                          options and risks were discussed with the patient.  ?                          All questions were answered, and informed consent  ?                          was obtained. Prior Anticoagulants: The patient has  ?                          taken no previous anticoagulant or antiplatelet  ?                          agents. ASA Grade Assessment: II - A patient with  ?  mild systemic disease. After reviewing the risks  ?                          and benefits, the patient was deemed in  ?                          satisfactory condition to undergo the procedure. ?                          After obtaining informed consent, the colonoscope  ?                          was passed under direct vision. Throughout the  ?                          procedure, the patient's blood pressure, pulse,  and  ?                          oxygen saturations were monitored continuously. The  ?                          CF [id number] #[id number] was introduced through the anus  ?                          and advanced to the the cecum, identified by  ?                          appendiceal orifice and ileocecal valve. A second  ?                          forward view of the right colon was performed. The  ?                          colonoscopy was performed without difficulty. The  ?                          patient tolerated the procedure well. The quality  ?                          of the bowel preparation was good. The terminal  ?                          ileum, ileocecal valve, appendiceal orifice, and  ?                          rectum were photographed. ?Scope In: 1:36:37 PM ?Scope Out: 1:46:31 PM ?Scope Withdrawal Time: 0 hours 5 minutes 57 seconds  ?Total Procedure Duration: 0 hours 9 minutes 54 seconds  ?Findings:                 The perianal and digital rectal examinations were  ?                          normal. ?  A few small-mouthed diverticula were found in the  ?                          sigmoid colon. ?                          The colon (entire examined portion) otherwise  ?                          appeared normal. Biopsies for histology were taken  ?                          with a cold forceps from the right colon, left  ?                          colon and right transverse colon for evaluation of  ?                          microscopic colitis. Estimated blood loss was  ?                          minimal. ?                          The exam was otherwise without abnormality on  ?                          direct and retroflexion views. ?Complications:            No immediate complications. Estimated blood loss:  ?                          Minimal. ?Estimated Blood Loss:     Estimated blood loss was minimal. ?Impression:               - Diverticulosis in the sigmoid colon. ?                           - The entire examined colon is normal. Biopsied. ?                          - The examination was otherwise normal on direct  ?                          and retroflexion views. ?Recommendation:           - Patient has a contact number available for  ?                          emergencies. The signs and symptoms of potential  ?                          delayed complications were discussed with the  ?                          patient. Return to normal activities tomorrow.  ?  Written discharge instructions were provided to the  ?                          patient. ?                          - Resume previous diet. ?                          - Continue present medications. ?                          - Await pathology results. ?                          - Repeat colonoscopy in 5 years for surveillance  ?                          given the family history. ?                          - Emerging evidence supports eating a diet of  ?                          fruits, vegetables, grains, calcium, and yogurt  ?                          while reducing red meat and alcohol may reduce the  ?                          risk of colon cancer. ?                          - Office follow-up to review these results and make  ?                          further recommendations re: change in bowel habits. ?Thornton Park MD, MD ?07/07/2021 1:52:49 PM ?This report has been signed electronically. ?

## 2021-07-11 ENCOUNTER — Telehealth: Payer: Self-pay | Admitting: *Deleted

## 2021-07-11 ENCOUNTER — Telehealth: Payer: Self-pay

## 2021-07-11 NOTE — Telephone Encounter (Signed)
?  Follow up Call- ? ?Call back number 07/07/2021  ?Post procedure Call Back phone  # [phone]  ?Permission to leave phone message Yes  ?Some recent data might be hidden  ?  ? ?Patient questions: ? ?Do you have a fever, pain , or abdominal swelling? No. ?Pain Score  0 * ? ?Have you tolerated food without any problems? Yes.   ? ?Have you been able to return to your normal activities? Yes.   ? ?Do you have any questions about your discharge instructions: ?Diet   No. ?Medications  No. ?Follow up visit  No. ? ?Do you have questions or concerns about your Care? No. ? ?Actions: ?* If pain score is 4 or above: ?No action needed, pain <4. ? ? ?

## 2021-07-11 NOTE — Telephone Encounter (Signed)
Attempted to call patient for their post-procedure follow-up call. No answer. Left voicemail.   

## 2021-07-18 ENCOUNTER — Telehealth: Payer: Self-pay | Admitting: Gastroenterology

## 2021-07-18 ENCOUNTER — Other Ambulatory Visit: Payer: Managed Care, Other (non HMO)

## 2021-07-18 ENCOUNTER — Ambulatory Visit: Payer: Managed Care, Other (non HMO) | Admitting: Physician Assistant

## 2021-07-18 DIAGNOSIS — R197 Diarrhea, unspecified: Secondary | ICD-10-CM

## 2021-07-18 NOTE — Telephone Encounter (Signed)
Inbound call from patient stating that she dropped of her stool sample to the lab this morning and is seeking advice if she still needs to come in her appointment with Ellouise Newer at 3:30 today. Please advise.  ?

## 2021-07-18 NOTE — Telephone Encounter (Signed)
Called pt to reschedule appt to allow ample time for results to be received. Appt has been rescheduled to 07/26/21 @ 130pm with Ellouise Newer, PA ?

## 2021-07-21 LAB — CALPROTECTIN, FECAL: Calprotectin, Fecal: <16 ug/g (ref 0–120)

## 2021-07-22 LAB — GI PROFILE, STOOL, PCR

## 2021-07-26 ENCOUNTER — Ambulatory Visit (INDEPENDENT_AMBULATORY_CARE_PROVIDER_SITE_OTHER): Payer: Managed Care, Other (non HMO) | Admitting: Physician Assistant

## 2021-07-26 ENCOUNTER — Encounter: Payer: Self-pay | Admitting: Physician Assistant

## 2021-07-26 VITALS — BP 104/70 | HR 80 | Ht 58.25 in | Wt 118.0 lb

## 2021-07-26 DIAGNOSIS — K582 Mixed irritable bowel syndrome: Secondary | ICD-10-CM | POA: Diagnosis not present

## 2021-07-26 LAB — PANCREATIC ELASTASE, FECAL: Pancreatic Elastase-1, Stool: >500 mcg/g

## 2021-07-26 NOTE — Progress Notes (Signed)
Reviewed and agree with management plans. ? ?Waco Foerster L. Travonne Schowalter, MD, MPH  ?

## 2021-07-26 NOTE — Progress Notes (Signed)
? ?Chief Complaint: Follow-up change in bowel habits ? ?HPI: ?   [name] is a 56 year old female with a past medical history as listed below including depression and reflux, known to Dr. Tarri Glenn, who returns to clinic today for follow-up of her change in bowel habits. ?   05/30/2021 patient seen in clinic by Dr. Tarri Glenn for change in bowel habits towards chronic diarrhea which she thought may be related to anxiety.  That time noted a previous colonoscopy with fair prep in 2016.  It was recommended she have repeat with a 2-day prep.  Also noted a family history of colon cancer in her father the age of 15.  She had labs to include a TSH and BMP as well as stool studies including a path panel, fecal calprotectin and fecal elastase. ?   07/07/2021 colonoscopy with diverticulosis in the sigmoid colon and otherwise normal.  Repeat recommended in 5 years given family history of colon cancer.  Random colon biopsies were normal. ?   07/18/2021 stool studies including fecal calprotectin, pancreatic elastase and GI profile were all normal/negative. ?   Today, the patient tells me that she has actually had a change towards constipation because she is using Flexeril, Gabapentin and Toradol for some pain in her neck and arm.  Due to this she has been having to use a stool softener, she has taken them on 2 occasions already and tells me she actually took 2 this morning but has not had a bowel movement yet.  When she does have a bowel movement over the past few days it is hard and she is straining. ?   Again believes that a lot of her stomach problems are related to anxiety because she feels "all my stress in my stomach". ?   Denies fever, chills, weight loss, blood in her stool or symptoms that awaken her from sleep. ? ?Past Medical History:  ?Diagnosis Date  ? Abnormal uterine bleeding (AUB)   ? Anemia   ? Anxiety   ? Complication of anesthesia   ? w/  11-12-2014 at [facility] pt states one side of tongue numb, facial swelling , and sob  next day after sugery  ? Depression   ? GERD (gastroesophageal reflux disease)   ? takes pepcid as needed   ? Hypertension   ? Uterine fibroid   ? Wears contact lenses   ? ? ?Past Surgical History:  ?Procedure Laterality Date  ? BILATERAL SALPINGECTOMY Bilateral 04/27/2015  ? Procedure: BILATERAL SALPINGECTOMY;  Surgeon: Molli Posey, MD;  Location: [facility];  Service: Gynecology;  Laterality: Bilateral;  ? COLONOSCOPY  09/23/2014  ? COLONOSCOPY WITH PROPOFOL  07/07/2021  ? diarrhea- [place]  ? DILATATION & CURETTAGE/HYSTEROSCOPY WITH MYOSURE N/A 11/12/2014  ? Procedure: DILATATION & CURETTAGE/HYSTEROSCOPY;  Surgeon: Molli Posey, MD;  Location: [facility];  Service: Gynecology;  Laterality: N/A;  ? LAPAROSCOPIC ASSISTED VAGINAL HYSTERECTOMY N/A 04/27/2015  ? Procedure: LAPAROSCOPIC ASSISTED VAGINAL HYSTERECTOMY;  Surgeon: Molli Posey, MD;  Location: [facility];  Service: Gynecology;  Laterality: N/A;  ? TONSILLECTOMY  04/25/1975  ? WISDOM TOOTH EXTRACTION  04/25/1983  ? ? ?Current Outpatient Medications  ?Medication Sig Dispense Refill  ? amLODipine (NORVASC) 5 MG tablet Take 5 mg by mouth daily.    ? ARIPiprazole (ABILIFY) 10 MG tablet Take 10 mg by mouth daily.    ? calcium carbonate (TUMS - DOSED IN MG ELEMENTAL CALCIUM) 500 MG chewable tablet 1 tablet    ? Cholecalciferol (VITAMIN  D3) 1.25 MG (50000 UT) CAPS Take 1 capsule by mouth every 14 (fourteen) days.    ? clonazePAM (KLONOPIN) 1 MG tablet Take 1 mg by mouth 2 (two) times daily.    ? cyclobenzaprine (FLEXERIL) 5 MG tablet Take 5 mg by mouth 3 (three) times daily.    ? fluticasone (FLONASE) 50 MCG/ACT nasal spray Place into both nostrils daily.    ? furosemide (LASIX) 20 MG tablet Take 20 mg by mouth.    ? gabapentin (NEURONTIN) 300 MG capsule Take 300 mg by mouth 3 (three) times daily.    ? lisinopril (PRINIVIL,ZESTRIL) 20 MG tablet Take 20 mg by mouth every morning.     ? ondansetron (ZOFRAN-ODT) 4 MG  disintegrating tablet Take 4 mg by mouth 2 (two) times daily as needed.    ? potassium chloride (KLOR-CON) 10 MEQ tablet Take 10 mEq by mouth daily.    ? TRINTELLIX 10 MG TABS tablet Take 10 mg by mouth daily. (Patient not taking: Reported on 07/07/2021)    ? venlafaxine XR (EFFEXOR-XR) 150 MG 24 hr capsule Take 300 mg by mouth every evening.     ? venlafaxine XR (EFFEXOR-XR) 37.5 MG 24 hr capsule Take 37.5 mg by mouth 2 (two) times daily. (Patient not taking: Reported on 07/07/2021)    ? ?No current facility-administered medications for this visit.  ? ? ?Allergies as of 07/26/2021  ? (No Known Allergies)  ? ? ?Family History  ?Problem Relation Age of Onset  ? Colon cancer Father 68  ? ? ?Social History  ? ?Socioeconomic History  ? Marital status: Divorced  ?  Spouse name: Not on file  ? Number of children: Not on file  ? Years of education: Not on file  ? Highest education level: Not on file  ?Occupational History  ? Occupation: retired  ?Tobacco Use  ? Smoking status: Every Day  ?  Packs/day: 0.50  ?  Years: 3.00  ?  Pack years: 1.50  ?  Types: Cigarettes  ? Smokeless tobacco: Never  ?Substance and Sexual Activity  ? Alcohol use: Yes  ?  Alcohol/week: 2.0 standard drinks  ?  Types: 2 Standard drinks or equivalent per week  ?  Comment: socially  ? Drug use: No  ? Sexual activity: Not on file  ?Other Topics Concern  ? Not on file  ?Social History Narrative  ? Not on file  ? ?Social Determinants of Health  ? ?Financial Resource Strain: Not on file  ?Food Insecurity: Not on file  ?Transportation Needs: Not on file  ?Physical Activity: Not on file  ?Stress: Not on file  ?Social Connections: Not on file  ?Intimate Partner Violence: Not on file  ? ? ?Review of Systems:    ?Constitutional: No weight loss, fever or chills ?Cardiovascular: No chest pain ?Respiratory: No SOB  ?Gastrointestinal: See HPI and otherwise negative ? ? Physical Exam:  ?Vital signs: ?BP 104/70 (BP Location: Left Arm, Patient Position: Sitting, Cuff  Size: Normal)   Pulse 80   Ht 4' 10.25" (1.48 m) Comment: height measured without shoes  Wt 118 lb (53.5 kg)   LMP 04/27/2015 (Approximate)   BMI 24.45 kg/m?   ? ?Constitutional:   Pleasant Caucasian female appears to be in NAD, Well developed, Well nourished, alert and cooperative  ?Respiratory: Respirations even and unlabored. Lungs clear to auscultation bilaterally.   No wheezes, crackles, or rhonchi.  ?Cardiovascular: Normal S1, S2. No MRG. Regular rate and rhythm. No peripheral edema, cyanosis or pallor.  ?  Gastrointestinal:  Soft, nondistended, nontender. No rebound or guarding. Normal bowel sounds. No appreciable masses or hepatomegaly. ?Rectal:  Not performed.  ?Psychiatric: Demonstrates good judgement and reason without abnormal affect or behaviors. ? ?RELEVANT LABS AND IMAGING: ?CBC ?   ?Component Value Date/Time  ? WBC 9.6 04/28/2015 0511  ? RBC 2.74 (L) 04/28/2015 0511  ? HGB 9.4 (L) 04/28/2015 0511  ? HCT 27.8 (L) 04/28/2015 0511  ? PLT 233 04/28/2015 0511  ? MCV 101.5 (H) 04/28/2015 0511  ? MCH 34.3 (H) 04/28/2015 0511  ? MCHC 33.8 04/28/2015 0511  ? RDW 13.1 04/28/2015 0511  ? ? ?CMP  ?   ?Component Value Date/Time  ? NA 136 05/30/2021 0957  ? K 4.2 05/30/2021 0957  ? CL 99 05/30/2021 0957  ? CO2 30 05/30/2021 0957  ? GLUCOSE 88 05/30/2021 0957  ? BUN 6 05/30/2021 0957  ? CREATININE 0.72 05/30/2021 0957  ? CALCIUM 9.2 05/30/2021 0957  ? GFRNONAA >60 04/27/2015 2595  ? GFRAA >60 04/27/2015 6387  ? ? ?Assessment: ?1. IBS: with diarrhea initially, full work-up with stool studies and thyroid testing as well as a colonoscopy which were all negative, feels a strong link to her anxiety, now actually constipated meds for shoulder pain; this is IBS ? ?Plan: ?1.  Discussed with patient that right now since she is constipated recommend that she start MiraLAX on a daily basis.  Discussed titration of this. ?2.  Recommend she discontinue stool softeners as these are not working well. ?3.  Discussed with  patient if she starts back with days of diarrhea would recommend a trial of an antispasmodic such as Dicyclomine or Hyoscyamine. ?4.  Patient will contact us in the future pending on what happens with her symptoms. ?

## 2021-07-26 NOTE — Patient Instructions (Signed)
If you are age 58 or older, your body mass index should be between 23-30. Your Body mass index is 24.45 kg/m?Marland Kitchen If this is out of the aforementioned range listed, please consider follow up with your Primary Care Provider. ? ?If you are age 83 or younger, your body mass index should be between 19-25. Your Body mass index is 24.45 kg/m?Marland Kitchen If this is out of the aformentioned range listed, please consider follow up with your Primary Care Provider.  ? ?________________________________________________________ ? ?[facility] providers would like to encourage you to use [facility] to communicate with providers for non-urgent requests or questions.  Due to long hold times on the telephone, sending your provider a message by [facility] may be a faster and more efficient way to get a response.  Please allow 48 business hours for a response.  Please remember that this is for non-urgent requests.  ?_______________________________________________________ ? ?Start Miralax daily for now. ? ?Please follow up as needed ?

## 2021-09-28 ENCOUNTER — Other Ambulatory Visit: Payer: Self-pay | Admitting: Internal Medicine

## 2021-09-28 DIAGNOSIS — I10 Essential (primary) hypertension: Secondary | ICD-10-CM

## 2021-11-17 ENCOUNTER — Ambulatory Visit
Admission: RE | Admit: 2021-11-17 | Discharge: 2021-11-17 | Disposition: A | Discharge status: Home / Self Care | Payer: No Typology Code available for payment source | Source: Ambulatory Visit | Attending: Internal Medicine | Admitting: Internal Medicine

## 2021-11-17 DIAGNOSIS — I10 Essential (primary) hypertension: Secondary | ICD-10-CM

## 2022-10-02 ENCOUNTER — Other Ambulatory Visit: Payer: Self-pay | Admitting: Internal Medicine

## 2022-10-02 DIAGNOSIS — K7689 Other specified diseases of liver: Secondary | ICD-10-CM

## 2022-10-03 ENCOUNTER — Ambulatory Visit
Admission: RE | Admit: 2022-10-03 | Discharge: 2022-10-03 | Disposition: A | Discharge status: Home / Self Care | Payer: Managed Care, Other (non HMO) | Source: Ambulatory Visit | Attending: Internal Medicine | Admitting: Internal Medicine

## 2022-10-03 DIAGNOSIS — K7689 Other specified diseases of liver: Secondary | ICD-10-CM

## 2022-10-05 ENCOUNTER — Other Ambulatory Visit: Payer: Self-pay | Admitting: Internal Medicine

## 2022-10-05 DIAGNOSIS — K7689 Other specified diseases of liver: Secondary | ICD-10-CM

## 2022-10-05 DIAGNOSIS — N281 Cyst of kidney, acquired: Secondary | ICD-10-CM

## 2022-10-05 DIAGNOSIS — N2889 Other specified disorders of kidney and ureter: Secondary | ICD-10-CM

## 2022-10-15 ENCOUNTER — Ambulatory Visit (HOSPITAL_COMMUNITY)
Admission: RE | Admit: 2022-10-15 | Discharge: 2022-10-15 | Disposition: A | Discharge status: Home / Self Care | Payer: Medicare Other | Source: Ambulatory Visit | Attending: Internal Medicine | Admitting: Internal Medicine

## 2022-10-15 DIAGNOSIS — N281 Cyst of kidney, acquired: Secondary | ICD-10-CM | POA: Diagnosis present

## 2022-10-15 DIAGNOSIS — K7689 Other specified diseases of liver: Secondary | ICD-10-CM | POA: Diagnosis present

## 2022-10-15 DIAGNOSIS — N2889 Other specified disorders of kidney and ureter: Secondary | ICD-10-CM | POA: Insufficient documentation

## 2022-10-15 MED ORDER — GADOBUTROL 1 MMOL/ML IV SOLN
5.0000 mL | Freq: Once | INTRAVENOUS | Status: AC | PRN
  Administered 2022-10-15: 5 mL via INTRAVENOUS

## 2022-11-15 ENCOUNTER — Other Ambulatory Visit: Payer: Managed Care, Other (non HMO)

## 2023-02-21 ENCOUNTER — Other Ambulatory Visit: Payer: Self-pay | Admitting: Student

## 2023-02-21 DIAGNOSIS — M5412 Radiculopathy, cervical region: Secondary | ICD-10-CM

## 2023-03-12 ENCOUNTER — Other Ambulatory Visit: Payer: Medicare Other

## 2023-03-30 ENCOUNTER — Ambulatory Visit
Admission: RE | Admit: 2023-03-30 | Discharge: 2023-03-30 | Disposition: A | Discharge status: Home / Self Care | Payer: Medicare Other | Source: Ambulatory Visit | Attending: Student

## 2023-03-30 DIAGNOSIS — M5412 Radiculopathy, cervical region: Secondary | ICD-10-CM

## 2023-04-19 IMAGING — MR MR CERVICAL SPINE W/O CM
4 of 5 series · 28 of 48 positions shown · non-contrast
Comparison: Previous MRI from 05/27/2012.

CLINICAL DATA: Initial evaluation for bilateral arm numbness and
tingling.

EXAM:
MRI CERVICAL SPINE WITHOUT CONTRAST
TECHNIQUE: Multiplanar, multisequence MR imaging of the cervical spine was
performed. No intravenous contrast was administered.

[Series 2: T2 · sagittal · 3.0mm · 0.66mm/px · 8 of 13 slices shown (1 of 2)]
[im 1/13]
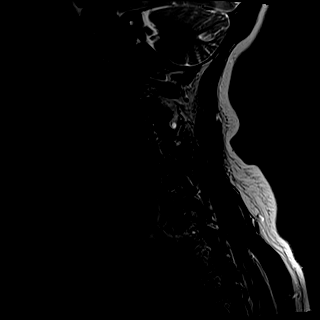
[im 2/13]
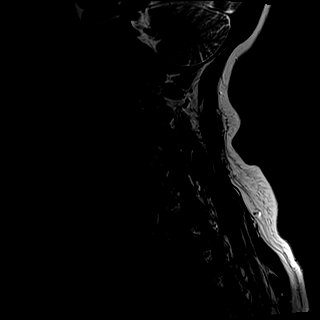
[im 4/13]
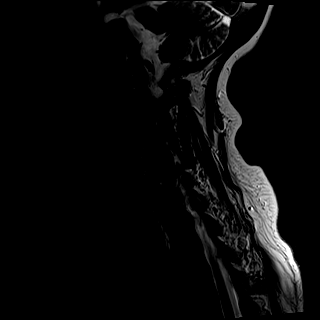
[im 6/13]
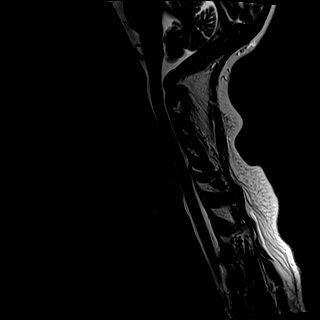
[im 7/13]
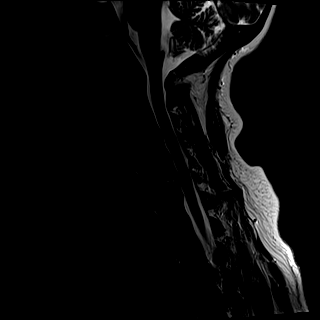
[im 9/13]
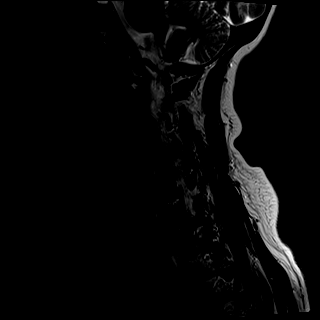
[im 11/13]
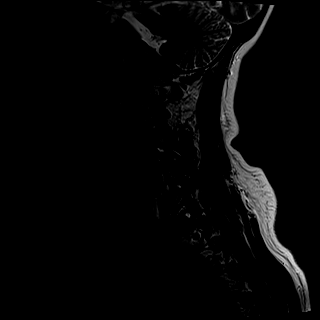
[im 13/13]
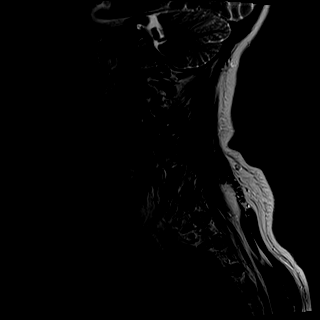

[Series 3: T1 · sagittal · 3.0mm · 0.41mm/px · 7 of 13 slices shown]
[im 1/13]
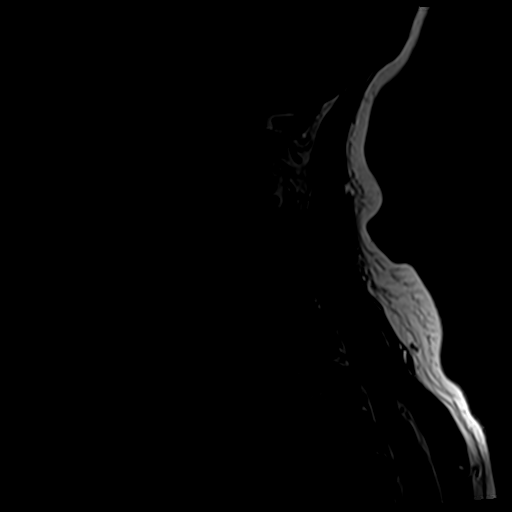
[im 3/13]
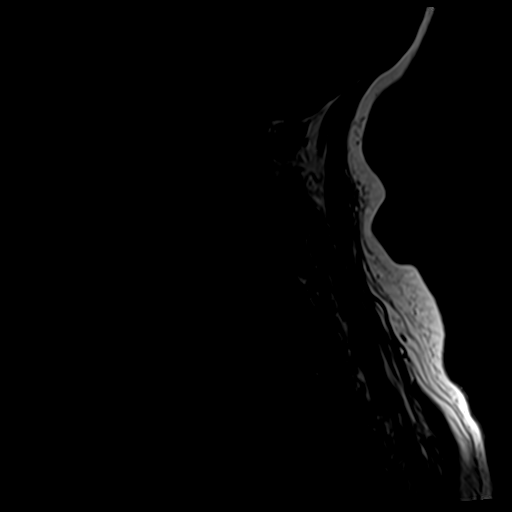
[im 5/13]
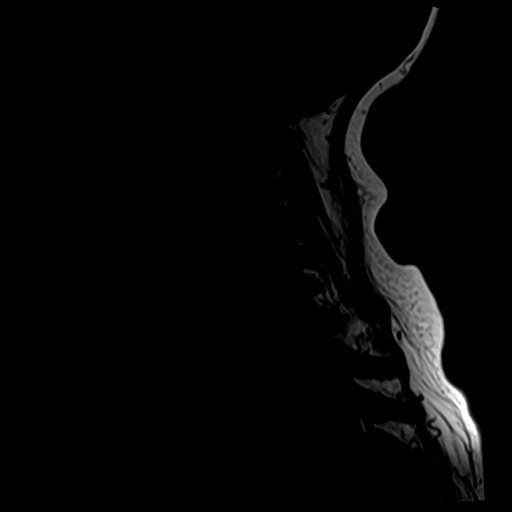
[im 7/13]
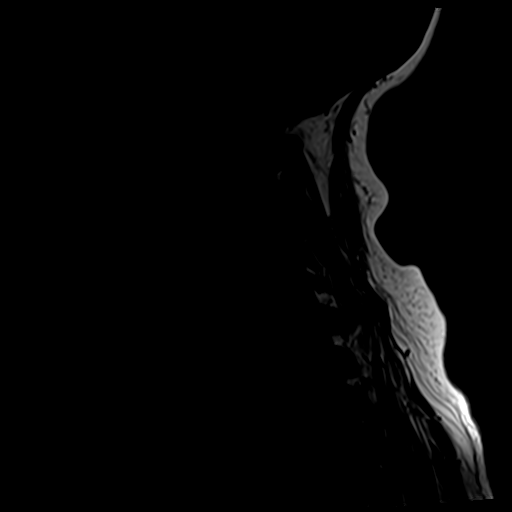
[im 9/13]
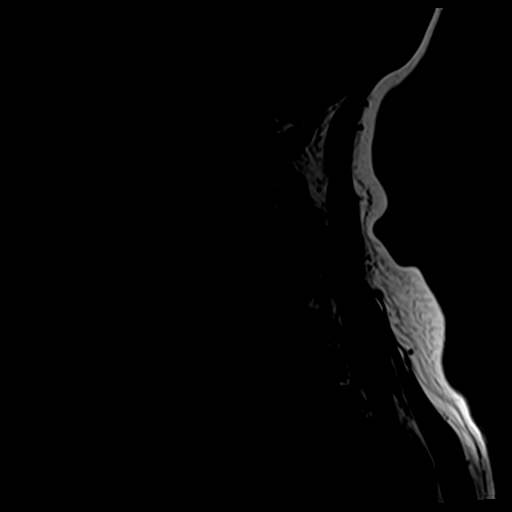
[im 11/13]
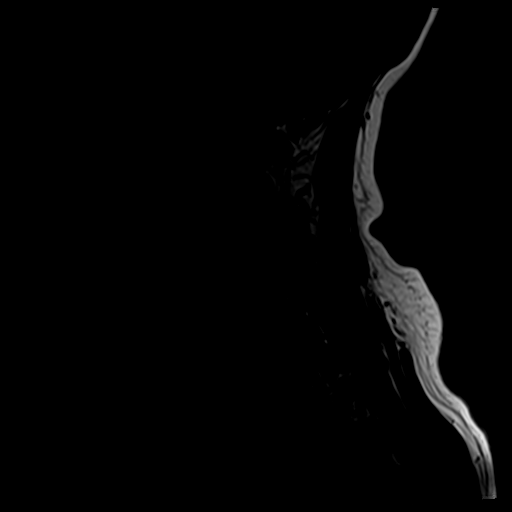
[im 13/13]
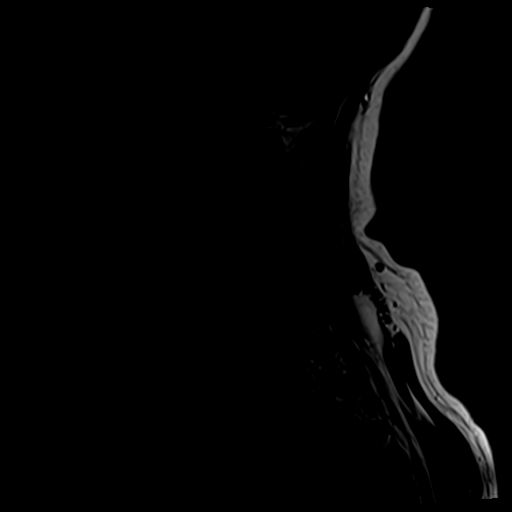

[Series 4: tir sag · sagittal · 3.0mm · 0.41mm/px · 4 of 13 slices shown]
[im 1/13]
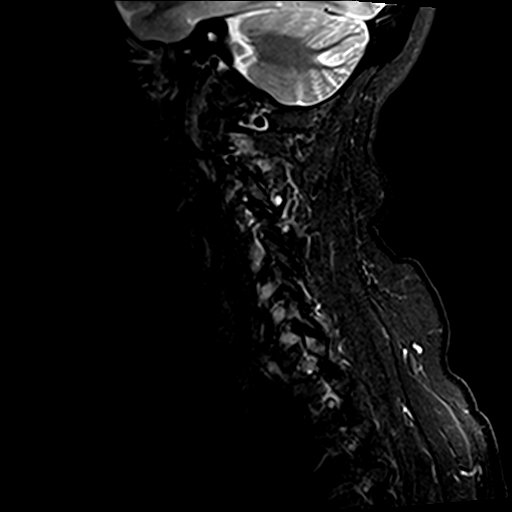
[im 3/13]
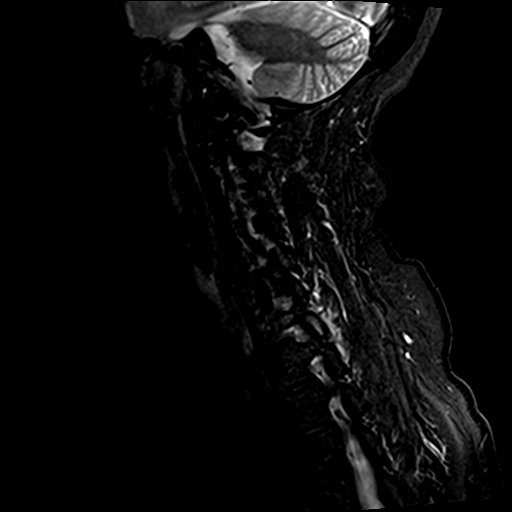
[im 7/13]
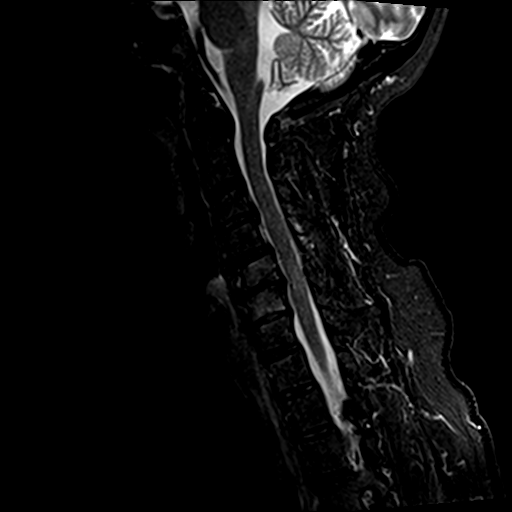
[im 11/13]
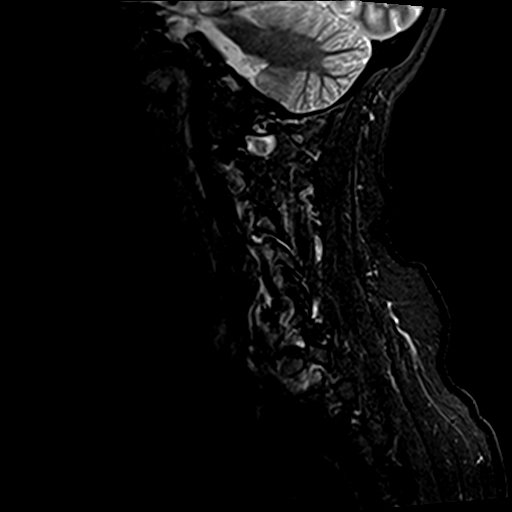

[Series 6: T2 · axial · 3.0mm · 0.70mm/px · z∈[-53,+29]mm · 9 of 25 slices shown (2 of 2)]
[im 1/25]
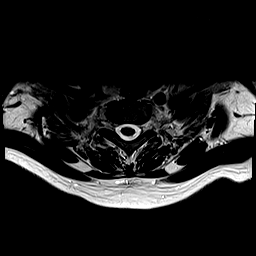
[im 5/25]
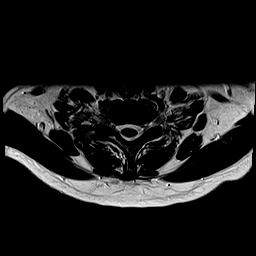
[im 9/25]
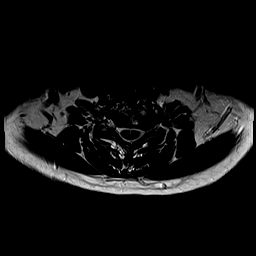
[im 11/25]
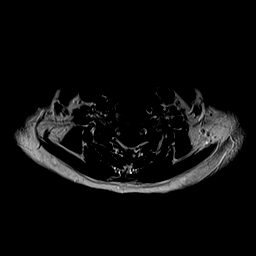
[im 13/25]
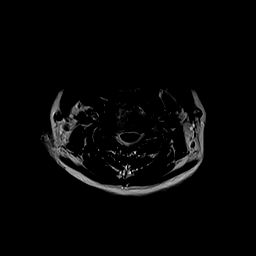
[im 15/25]
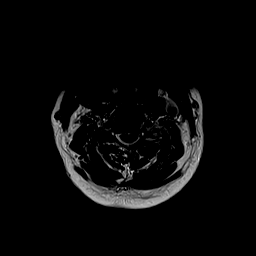
[im 17/25]
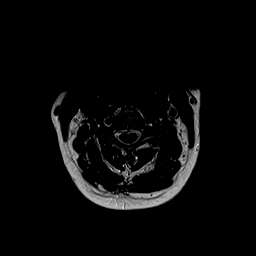
[im 21/25]
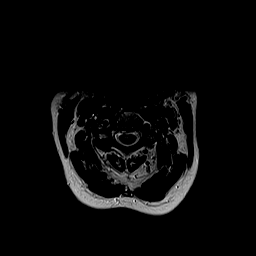
[im 25/25]
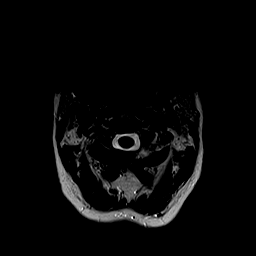

[28 of 48 positions shown; findings below may reference images not displayed]

FINDINGS: Alignment: Chronic straightening of the normal cervical lordosis. 2
mm anterolisthesis of C4 on C5, with trace anterolisthesis of C7 on
T1. Findings chronic and facet mediated.

Vertebrae: Vertebral body height maintained without acute or chronic
fracture. Bone marrow signal intensity within normal limits. No
discrete or worrisome osseous lesions. Discogenic reactive endplate
change with marrow edema present about the C5-6 and C6-7
interspaces. Additionally, reactive marrow edema present about the
right C2-3 and C7-T1 facets, as well as the left C4-5 facet.
Findings could contribute to underlying neck pain.

Cord: Normal signal and morphology.

Posterior Fossa, vertebral arteries, paraspinal tissues: Visualized
brain and posterior fossa within normal limits. Craniocervical
junction normal. Paraspinous and prevertebral soft tissues within
normal limits. Normal intravascular flow voids seen within the
vertebral arteries bilaterally.

Disc levels:

C2-C3: Normal interspace. Moderate right-sided facet arthrosis with
associated reactive marrow edema. No spinal stenosis. Mild to
moderate right C3 foraminal narrowing. Left neural foramina remains
patent.

C3-C4: Disc bulge with bilateral uncovertebral spurring, asymmetric
to the right. Flattening and partial effacement of the ventral
thecal sac with mild cord flattening, but no cord signal changes.
Superimposed mild right-sided facet hypertrophy. Mild spinal
stenosis. Moderate right with mild left C4 foraminal narrowing.

C4-C5: Anterolisthesis. Disc bulge with bilateral uncovertebral
spurring. Moderate left-sided facet arthrosis. Flattening and
partial effacement of the ventral thecal sac with resultant mild
spinal stenosis. Moderate left worse than right C5 foraminal
narrowing.

C5-C6: Degenerative intervertebral disc space narrowing with diffuse
disc osteophyte complex. Broad posterior component largely effaces
the ventral thecal sac with resultant moderate spinal stenosis. Mild
cord flattening without cord signal changes. Severe right worse than
left C6 foraminal narrowing.

C6-C7: Degenerative intervertebral disc space narrowing with diffuse
disc bulge and prominent left-sided uncovertebral spurring. Mild
right greater than left facet hypertrophy. Flattening of the ventral
thecal sac without significant spinal stenosis. Severe left C7
foraminal narrowing. Right neural foramina remains patent.

C7-T1: Normal interspace. Moderate right with mild left facet
hypertrophy. No spinal stenosis. Foramina remain patent.

Visualized upper thoracic spine demonstrates no significant finding.
IMPRESSION: 1. Multilevel cervical spondylosis with resultant mild to moderate
diffuse spinal stenosis at C3-4 through C5-6, most pronounced at
C5-6. Associated moderate to severe C4 through C7 foraminal
narrowing as above.
2. Discogenic reactive marrow edema about the C5-6 and C6-7
interspaces, which could contribute to neck pain.
3. Additional reactive marrow edema about the right C2-3 and C7-T1
facets, as well as the left C4-5 facet. Findings could also
contribute to underlying neck pain.

## 2023-11-14 ENCOUNTER — Other Ambulatory Visit: Payer: Self-pay | Admitting: Nephrology

## 2023-11-14 DIAGNOSIS — Q613 Polycystic kidney, unspecified: Secondary | ICD-10-CM

## 2023-12-03 ENCOUNTER — Ambulatory Visit
Admission: RE | Admit: 2023-12-03 | Discharge: 2023-12-03 | Disposition: A | Discharge status: Home / Self Care | Source: Ambulatory Visit | Attending: Nephrology | Admitting: Nephrology

## 2023-12-03 DIAGNOSIS — Q613 Polycystic kidney, unspecified: Secondary | ICD-10-CM

## 2023-12-03 MED ORDER — GADOPICLENOL 0.5 MMOL/ML IV SOLN
6.0000 mL | Freq: Once | INTRAVENOUS | Status: AC | PRN
  Administered 2023-12-03 (×2): 6 mL via INTRAVENOUS
